# Patient Record
Sex: Female | Born: 1950 | Race: White | Hispanic: No | Marital: Single | Smoking: Never smoker
Health system: Southern US, Community
[De-identification: ages and names within clinical notes are randomized; demographics above are authoritative.]

## PROBLEM LIST (undated history)

## (undated) DIAGNOSIS — S83209A Unspecified tear of unspecified meniscus, current injury, unspecified knee, initial encounter: Secondary | ICD-10-CM

## (undated) DIAGNOSIS — G4730 Sleep apnea, unspecified: Secondary | ICD-10-CM

## (undated) DIAGNOSIS — C801 Malignant (primary) neoplasm, unspecified: Secondary | ICD-10-CM

## (undated) DIAGNOSIS — M8580 Other specified disorders of bone density and structure, unspecified site: Secondary | ICD-10-CM

## (undated) DIAGNOSIS — E039 Hypothyroidism, unspecified: Secondary | ICD-10-CM

## (undated) DIAGNOSIS — F419 Anxiety disorder, unspecified: Secondary | ICD-10-CM

## (undated) HISTORY — PX: MYOMECTOMY: SHX85

## (undated) HISTORY — DX: Hypothyroidism, unspecified: E03.9

## (undated) HISTORY — DX: Unspecified tear of unspecified meniscus, current injury, unspecified knee, initial encounter: S83.209A

## (undated) HISTORY — DX: Sleep apnea, unspecified: G47.30

## (undated) HISTORY — DX: Anxiety disorder, unspecified: F41.9

## (undated) HISTORY — PX: ABDOMINAL HYSTERECTOMY: SHX81

## (undated) HISTORY — DX: Malignant (primary) neoplasm, unspecified: C80.1

## (undated) HISTORY — PX: BREAST SURGERY: SHX581

## (undated) HISTORY — PX: ROTATOR CUFF REPAIR: SHX139

## (undated) HISTORY — PX: HYSTEROSCOPY: SHX211

## (undated) HISTORY — PX: OOPHORECTOMY: SHX86

## (undated) HISTORY — DX: Other specified disorders of bone density and structure, unspecified site: M85.80

---

## 1998-03-25 ENCOUNTER — Other Ambulatory Visit: Admission: RE | Admit: 1998-03-25 | Discharge: 1998-03-25 | Payer: Self-pay | Admitting: Gynecology

## 1998-12-30 ENCOUNTER — Encounter: Admission: RE | Admit: 1998-12-30 | Discharge: 1998-12-30 | Payer: Self-pay | Admitting: Cardiology

## 1998-12-30 ENCOUNTER — Encounter: Payer: Self-pay | Admitting: Cardiology

## 1999-04-14 ENCOUNTER — Other Ambulatory Visit: Admission: RE | Admit: 1999-04-14 | Discharge: 1999-04-14 | Payer: Self-pay | Admitting: Obstetrics and Gynecology

## 2000-06-14 ENCOUNTER — Other Ambulatory Visit: Admission: RE | Admit: 2000-06-14 | Discharge: 2000-06-14 | Payer: Self-pay | Admitting: Gynecology

## 2001-06-21 ENCOUNTER — Other Ambulatory Visit: Admission: RE | Admit: 2001-06-21 | Discharge: 2001-06-21 | Payer: Self-pay | Admitting: Gynecology

## 2001-07-17 ENCOUNTER — Encounter: Admission: RE | Admit: 2001-07-17 | Discharge: 2001-07-17 | Payer: Self-pay | Admitting: *Deleted

## 2001-07-17 ENCOUNTER — Encounter: Payer: Self-pay | Admitting: *Deleted

## 2002-06-26 ENCOUNTER — Other Ambulatory Visit: Admission: RE | Admit: 2002-06-26 | Discharge: 2002-06-26 | Payer: Self-pay | Admitting: Gynecology

## 2002-08-01 ENCOUNTER — Ambulatory Visit (HOSPITAL_COMMUNITY): Admission: RE | Admit: 2002-08-01 | Discharge: 2002-08-01 | Payer: Self-pay | Admitting: Gastroenterology

## 2003-07-29 ENCOUNTER — Other Ambulatory Visit: Admission: RE | Admit: 2003-07-29 | Discharge: 2003-07-29 | Payer: Self-pay | Admitting: Gynecology

## 2004-08-04 ENCOUNTER — Other Ambulatory Visit: Admission: RE | Admit: 2004-08-04 | Discharge: 2004-08-04 | Payer: Self-pay | Admitting: Gynecology

## 2004-12-22 ENCOUNTER — Emergency Department (HOSPITAL_COMMUNITY): Admission: EM | Admit: 2004-12-22 | Discharge: 2004-12-22 | Payer: Self-pay | Admitting: Emergency Medicine

## 2005-02-09 ENCOUNTER — Encounter: Admission: RE | Admit: 2005-02-09 | Discharge: 2005-02-09 | Payer: Self-pay | Admitting: Family Medicine

## 2005-02-22 DIAGNOSIS — C801 Malignant (primary) neoplasm, unspecified: Secondary | ICD-10-CM

## 2005-02-22 HISTORY — DX: Malignant (primary) neoplasm, unspecified: C80.1

## 2005-02-22 HISTORY — PX: BLADDER SURGERY: SHX569

## 2005-07-15 ENCOUNTER — Ambulatory Visit (HOSPITAL_BASED_OUTPATIENT_CLINIC_OR_DEPARTMENT_OTHER): Admission: RE | Admit: 2005-07-15 | Discharge: 2005-07-15 | Payer: Self-pay | Admitting: Urology

## 2005-07-15 ENCOUNTER — Encounter (INDEPENDENT_AMBULATORY_CARE_PROVIDER_SITE_OTHER): Payer: Self-pay | Admitting: Specialist

## 2005-08-06 ENCOUNTER — Other Ambulatory Visit: Admission: RE | Admit: 2005-08-06 | Discharge: 2005-08-06 | Payer: Self-pay | Admitting: Gynecology

## 2006-02-02 ENCOUNTER — Ambulatory Visit (HOSPITAL_COMMUNITY): Admission: RE | Admit: 2006-02-02 | Discharge: 2006-02-02 | Payer: Self-pay | Admitting: Urology

## 2006-04-22 ENCOUNTER — Encounter: Admission: RE | Admit: 2006-04-22 | Discharge: 2006-04-22 | Payer: Self-pay | Admitting: Family Medicine

## 2006-05-09 ENCOUNTER — Encounter: Admission: RE | Admit: 2006-05-09 | Discharge: 2006-05-09 | Payer: Self-pay | Admitting: Family Medicine

## 2006-08-16 ENCOUNTER — Other Ambulatory Visit: Admission: RE | Admit: 2006-08-16 | Discharge: 2006-08-16 | Payer: Self-pay | Admitting: Gynecology

## 2007-05-11 ENCOUNTER — Encounter: Admission: RE | Admit: 2007-05-11 | Discharge: 2007-05-11 | Payer: Self-pay | Admitting: Family Medicine

## 2007-06-01 ENCOUNTER — Encounter: Admission: RE | Admit: 2007-06-01 | Discharge: 2007-06-01 | Payer: Self-pay | Admitting: Family Medicine

## 2007-08-17 ENCOUNTER — Other Ambulatory Visit: Admission: RE | Admit: 2007-08-17 | Discharge: 2007-08-17 | Payer: Self-pay | Admitting: Gynecology

## 2008-01-08 ENCOUNTER — Ambulatory Visit: Payer: Self-pay | Admitting: Gynecology

## 2008-08-20 ENCOUNTER — Ambulatory Visit: Payer: Self-pay | Admitting: Gynecology

## 2008-08-20 ENCOUNTER — Encounter: Payer: Self-pay | Admitting: Gynecology

## 2008-08-20 ENCOUNTER — Other Ambulatory Visit: Admission: RE | Admit: 2008-08-20 | Discharge: 2008-08-20 | Payer: Self-pay | Admitting: Gynecology

## 2008-08-27 ENCOUNTER — Ambulatory Visit: Payer: Self-pay | Admitting: Gynecology

## 2009-02-07 ENCOUNTER — Encounter: Admission: RE | Admit: 2009-02-07 | Discharge: 2009-02-07 | Payer: Self-pay | Admitting: Family Medicine

## 2009-02-22 HISTORY — PX: EYE SURGERY: SHX253

## 2009-08-27 ENCOUNTER — Ambulatory Visit: Payer: Self-pay | Admitting: Gynecology

## 2009-08-27 ENCOUNTER — Other Ambulatory Visit: Admission: RE | Admit: 2009-08-27 | Discharge: 2009-08-27 | Payer: Self-pay | Admitting: Gynecology

## 2009-09-02 ENCOUNTER — Ambulatory Visit: Payer: Self-pay | Admitting: Gynecology

## 2009-10-07 ENCOUNTER — Ambulatory Visit: Payer: Self-pay | Admitting: Gynecology

## 2009-12-03 ENCOUNTER — Encounter: Admission: RE | Admit: 2009-12-03 | Discharge: 2009-12-03 | Payer: Self-pay | Admitting: Family Medicine

## 2010-04-29 ENCOUNTER — Ambulatory Visit (INDEPENDENT_AMBULATORY_CARE_PROVIDER_SITE_OTHER): Payer: BC Managed Care – PPO | Admitting: Gynecology

## 2010-04-29 DIAGNOSIS — B373 Candidiasis of vulva and vagina: Secondary | ICD-10-CM

## 2010-04-29 DIAGNOSIS — N898 Other specified noninflammatory disorders of vagina: Secondary | ICD-10-CM

## 2010-04-29 DIAGNOSIS — L293 Anogenital pruritus, unspecified: Secondary | ICD-10-CM

## 2010-04-29 DIAGNOSIS — B3731 Acute candidiasis of vulva and vagina: Secondary | ICD-10-CM

## 2010-07-10 NOTE — Op Note (Signed)
NAMEMONICIA, TSE                 ACCOUNT NO.:  [id number]   MEDICAL RECORD NO.:  [id number]          PATIENT TYPE:  AMB   LOCATION:  [facility]                         FACILITY:  [facility]   PHYSICIAN:  Bertram Millard. Dahlstedt, M.D.DATE OF BIRTH:  31-Jul-1950   DATE OF PROCEDURE:  07/15/2005  DATE OF DISCHARGE:                                 OPERATIVE REPORT   PREOPERATIVE DIAGNOSIS:  Bladder tumor.   POSTOPERATIVE DIAGNOSIS:  Bladder tumor.   PROCEDURE:  Cystoscopy, TURBT, 2 cm tumor.   SURGEON:  Bertram Millard. Dahlstedt, M.D.   ANESTHESIA:  General.   COMPLICATIONS:  None.   BRIEF HISTORY:  A 60 year old female with a history of hematuria.  She  initially presented about five months ago.  She had a CT urogram which she  was negative.  Follow-up urinalyses revealed clear urine.  She was recently  seen and she still had a little bit of discolored urine.  Cystoscopic  evaluation of the patient's bladder revealed a small tumor on the left  lateral wall.  She presents at this time for TURBT.  Risks and complications  have been discussed with the patient.   DESCRIPTION OF PROCEDURE:  The patient was administered preoperative IV  antibiotic and taken to the operating room where general anesthetic was  administered.  She was placed in dorsal lithotomy position.  Genitalia and  perineum were prepped and draped.  A 22-French panendoscope was advanced in  her bladder.  The entire bladder was inspected.  No trabeculations or  foreign bodies were noted.  Ureteral orifices were normal.  A 2 cm tumor was  found lateral and superior to the left ureteral orifice.  It was totally  removed with cold cup biopsy forceps, getting deep biopsies as well.  All of  these samples were sent as bladder tumor.  The biopsy site was then  cauterized well with the Bovie electrode.  No bleeding was seen.  At this  point, the bladder was drained.  It was not felt she needed a catheter.  B &  O suppository was placed.   The patient tolerated procedure well.  She is awakened, taken to PACU in  stable condition.  She will follow-up in one week.  I will call her with  pathology result.  She was discharged on urelle one p.o. q.6h. p.r.n.  bladder discomfort, Macrobid one p.o. b.i.d. x3 days and Darvocet one p.o.  q.4h. p.r.n. pain.      Bertram Millard. Dahlstedt, M.D.  Electronically Signed     SMD/MEDQ  D:  07/15/2005  T:  07/15/2005  Job:  [id number]   cc:   Talmadge Coventry, M.D.  Fax: [phone]   Allena Napoleon  Fax: [phone]

## 2010-07-10 NOTE — Op Note (Signed)
   NAME:  Mindy Burns, Mindy Burns                           ACCOUNT NO.:  [id number]   MEDICAL RECORD NO.:  [id number]                   PATIENT TYPE:  AMB   LOCATION:  ENDO                                 FACILITY:  [facility]   PHYSICIAN:  Anselmo Rod, M.D.               DATE OF BIRTH:  [birth year]   DATE OF PROCEDURE:  08/01/2002  DATE OF DISCHARGE:                                 OPERATIVE REPORT   PROCEDURE PERFORMED:  Screening colonoscopy.   ENDOSCOPIST:  Charna Elizabeth, M.D.   INSTRUMENT USED:  Olympus video colonoscope.   INDICATIONS FOR PROCEDURE:  The patient is a 60 year old white female with a  history of rectal bleeding.  Rule out colonic polyps, masses, etc.   PREPROCEDURE PREPARATION:  Informed consent was procured from the patient.  The patient was fasted for eight hours prior to the procedure and prepped  with a bottle of magnesium citrate and a gallon of GoLYTELY the night prior  to the procedure.   PREPROCEDURE PHYSICAL:  The patient had stable vital signs.  Neck supple.  Chest clear to auscultation.  S1 and S2 regular.  Abdomen soft with normal  bowel sounds.   DESCRIPTION OF PROCEDURE:  The patient was placed in left lateral decubitus  position and sedated with 60 mg of Demerol and 6 mg of Versed intravenously.  Once the patient was adequately sedated and maintained on low flow oxygen  and continuous cardiac monitoring, the Olympus video colonoscope was  advanced from the rectum to the cecum without difficulty.  The appendicular  orifice and ileocecal valve were clearly visualized and photographed.  No  masses, polyps, erosions, ulcerations or diverticula were seen.  Small  internal hemorrhoids were seen on retroflexion in the rectum.  A prominent  anal papilla was also noticed at this time.  The patient tolerated the  procedure well without complication.   IMPRESSION:  1. Normal colonoscopy up to the cecum except for nonbleeding internal     hemorrhoids.  2.  Prominent anal papilla seen on retroflexion.   RECOMMENDATIONS:  1. A high fiber diet with liberal fluid intake has been advocated.  2. Repeat colorectal cancer screening is recommended in the next five years     unless the patient develops any abnormal symptoms in the interim.  3. Outpatient follow-up as need arises in the future.                                                   Anselmo Rod, M.D.    JNM/MEDQ  D:  08/02/2002  T:  08/02/2002  Job:  [id number]   cc:   Christella Noa, M.D.  [address]  [place], [place] [address]  Fax: [phone]

## 2010-07-10 NOTE — Op Note (Signed)
   NAME:  Mindy Burns, Mindy Burns                           ACCOUNT NO.:  [id number]   MEDICAL RECORD NO.:  [id number]                   PATIENT TYPE:  AMB   LOCATION:  ENDO                                 FACILITY:  [facility]   PHYSICIAN:  Anselmo Rod, M.D.               DATE OF BIRTH:  09-10-1950   DATE OF PROCEDURE:  08/01/2002  DATE OF DISCHARGE:                                 OPERATIVE REPORT   PROCEDURE PERFORMED:  Screening colonoscopy.   ENDOSCOPIST:  Anselmo Rod, M.D.   INSTRUMENT USED:  Olympus video colonoscope.   INDICATIONS FOR PROCEDURE:  A 60 year old white female with a family history  of colon cancer   Dictation ended at this point.                                               Anselmo Rod, M.D.    JNM/MEDQ  D:  08/02/2002  T:  08/02/2002  Job:  [id number]   cc:   Christella Noa, M.D.  [address] [place]  [place], [place] [address]  Fax: [phone]

## 2010-10-13 ENCOUNTER — Encounter: Payer: BC Managed Care – PPO | Admitting: Gynecology

## 2010-10-14 ENCOUNTER — Encounter: Payer: BC Managed Care – PPO | Admitting: Gynecology

## 2010-10-15 ENCOUNTER — Ambulatory Visit (INDEPENDENT_AMBULATORY_CARE_PROVIDER_SITE_OTHER): Payer: BC Managed Care – PPO | Admitting: Gynecology

## 2010-10-15 ENCOUNTER — Encounter: Payer: Self-pay | Admitting: Gynecology

## 2010-10-15 ENCOUNTER — Encounter: Payer: BC Managed Care – PPO | Admitting: Gynecology

## 2010-10-15 ENCOUNTER — Other Ambulatory Visit (HOSPITAL_COMMUNITY)
Admission: RE | Admit: 2010-10-15 | Discharge: 2010-10-15 | Disposition: A | Discharge status: Home / Self Care | Payer: BC Managed Care – PPO | Source: Ambulatory Visit | Attending: Gynecology | Admitting: Gynecology

## 2010-10-15 VITALS — BP 120/70 | Ht 66.0 in | Wt 173.0 lb

## 2010-10-15 DIAGNOSIS — Z01419 Encounter for gynecological examination (general) (routine) without abnormal findings: Secondary | ICD-10-CM

## 2010-10-15 DIAGNOSIS — B373 Candidiasis of vulva and vagina: Secondary | ICD-10-CM

## 2010-10-15 DIAGNOSIS — C801 Malignant (primary) neoplasm, unspecified: Secondary | ICD-10-CM | POA: Insufficient documentation

## 2010-10-15 DIAGNOSIS — M949 Disorder of cartilage, unspecified: Secondary | ICD-10-CM

## 2010-10-15 DIAGNOSIS — M8580 Other specified disorders of bone density and structure, unspecified site: Secondary | ICD-10-CM

## 2010-10-15 DIAGNOSIS — F419 Anxiety disorder, unspecified: Secondary | ICD-10-CM | POA: Insufficient documentation

## 2010-10-15 DIAGNOSIS — E039 Hypothyroidism, unspecified: Secondary | ICD-10-CM | POA: Insufficient documentation

## 2010-10-15 DIAGNOSIS — N898 Other specified noninflammatory disorders of vagina: Secondary | ICD-10-CM

## 2010-10-15 MED ORDER — FLUCONAZOLE 150 MG PO TABS: 150.0000 mg | ORAL_TABLET | Freq: Once | ORAL | Status: AC

## 2010-10-15 NOTE — Progress Notes (Signed)
Mindy Burns 12/02/1950 [id number]        60 y.o.  for annual exam.  Doing well from a gynecologic standpoint. She does note feeling a little funny in the vaginal area not really irritated but different. 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 She's down to once a year followup with him.  She is being seen for anal fissures by Dr. Loreta Ave she is up-to-date with her colon screening.  Past medical history,surgical history, allergies, family history and social history were all reviewed and documented in the EPIC chart. ROS:  Was performed and pertinent positives and negatives are included in the history.  Exam: chaperone present Filed Vitals:   10/15/10 1418  BP: 120/70   General appearance  Normal Skin grossly normal Head/Neck normal with no cervical or supraclavicular adenopathy thyroid normal Lungs  clear Cardiac RR, without RMG Abdominal  soft, nontender, without masses, organomegaly or hernia Breasts  examined lying and sitting without masses, retractions, discharge or axillary adenopathy. Pelvic  Ext/BUS/vagina  normal white discharge noted KOH wet prep done Pap of cuff done  Adnexa  Without masses or tenderness    Anus and perineum  normal   Rectovaginal  normal sphincter tone without palpated masses or tenderness.  No overt fissures noted   Assessment/Plan:  60 y.o. female for annual exam. #1 Vaginal discharge. Wet prep positive for yeast we'll treat with Diflucan 150x1 dose follow up if symptoms persist or recur.  I suspect that this accounts for her feeling different vaginally if these symptoms persist despite being treated for usually present for evaluation. #2 History of osteopenia.  She has a history of osteopenia with a -1.7 on a bone density. Her study 2 years ago was normal at a -1. I've recommended she repeat bone density now if again normal then we'll go to less frequent screening  interval. Increase calcium vitamin D reviewed. #3 Health maintenance. Self breast exams on a month basis discussed encouraged. She is due for her mammogram now and knows to schedule this. She's up-to-date with colon screening at Dr. Loreta Ave office. No blood work was done today as was all done through her primary who actively follows her for her medical issues.    [name] MD, 4:28 PM 10/15/2010

## 2010-10-16 ENCOUNTER — Telehealth: Payer: Self-pay | Admitting: *Deleted

## 2010-10-16 DIAGNOSIS — K602 Anal fissure, unspecified: Secondary | ICD-10-CM

## 2010-10-16 NOTE — Telephone Encounter (Signed)
PT CALLED STATING THAT SHE AND DR.F SPOKE ABOUT A POSSIBLE RX FOR HER ANAL FISSURES ON HER OFFICE VISIT 10/15/10. NONE THING IN TF NOTES REGARDING THIS. TOLD PT THAT TF IS OUT AND WILL RETURN ON Monday. I WILL SENT MESSAGE TO HIM THEN. SHE WAS OKAY WITH THIS.

## 2010-10-19 MED ORDER — HYDROCORTISONE ACE-PRAMOXINE 2.5-1 % RE CREA: TOPICAL_CREAM | Freq: Three times a day (TID) | RECTAL | Status: AC

## 2010-10-19 NOTE — Telephone Encounter (Signed)
Pt called stating on office visit 10/15/10 you spoke about possible rx for her anal fissures? Nonething noted regarding this.  Please advise.

## 2010-10-19 NOTE — Telephone Encounter (Signed)
PT INFORMED WITH THE BELOW NOTE. 

## 2010-10-19 NOTE — Telephone Encounter (Signed)
I prescribed Analpram as directed.

## 2010-10-22 ENCOUNTER — Ambulatory Visit (INDEPENDENT_AMBULATORY_CARE_PROVIDER_SITE_OTHER): Payer: BC Managed Care – PPO | Admitting: Gynecology

## 2010-10-22 DIAGNOSIS — M949 Disorder of cartilage, unspecified: Secondary | ICD-10-CM

## 2010-10-22 DIAGNOSIS — M8580 Other specified disorders of bone density and structure, unspecified site: Secondary | ICD-10-CM

## 2010-10-23 ENCOUNTER — Telehealth: Payer: Self-pay | Admitting: Gynecology

## 2010-10-23 NOTE — Telephone Encounter (Signed)
Tell patient I reviewed her bone density report and compared them to her studies that were done at [facility].  Compared to our prior study 2 years ago performed here she has some bone loss and now is in the osteopenic range. When compared to her studies of Yolanda Bonine back in 07 there is no significant change. I think given this information I would do nothing different but repeat her bone density in 2 years.

## 2010-10-27 NOTE — Telephone Encounter (Signed)
Pt informed with the below message and that her bone density repeat should be in 2 years.

## 2011-02-01 ENCOUNTER — Ambulatory Visit (INDEPENDENT_AMBULATORY_CARE_PROVIDER_SITE_OTHER): Payer: BC Managed Care – PPO | Admitting: Gynecology

## 2011-02-01 ENCOUNTER — Encounter: Payer: Self-pay | Admitting: Gynecology

## 2011-02-01 ENCOUNTER — Ambulatory Visit: Payer: BC Managed Care – PPO | Admitting: Gynecology

## 2011-02-01 DIAGNOSIS — N644 Mastodynia: Secondary | ICD-10-CM

## 2011-02-01 NOTE — Progress Notes (Signed)
The patient presents complaining of one to 2 day history of left breast tenderness and questionable lump. She has history of very dense breasts and she thinks she feels a new lumbar is not quite sure but she still point tender in this one area which is new to her. No history of trauma nipple discharge or other symptoms. She is over a year from mammogram. She does get thermography and alternates [place] annually with mammograms.  Exam chaperone present Breasts examined lying and sitting without masses retractions discharge adenopathy bilaterally. The area she is pointing to his the left tail of Spence I do not feel any abnormalities underlying.  Assessment and plan: Left breast mastodynia. Will start with diagnostic mammography and ultrasound over this area and she knows importance of follow up. Patient will follow up with me after testing for triage based on results.

## 2011-02-01 NOTE — Patient Instructions (Signed)
Office will call to schedule mammogram and ultrasound

## 2011-02-05 ENCOUNTER — Telehealth: Payer: Self-pay | Admitting: *Deleted

## 2011-02-05 DIAGNOSIS — N644 Mastodynia: Secondary | ICD-10-CM

## 2011-02-05 NOTE — Telephone Encounter (Signed)
Patient informed appt set up with [facility] on 02/09/11 @ 10:30.  Order faxed.

## 2011-02-09 ENCOUNTER — Other Ambulatory Visit: Payer: Self-pay | Admitting: Gynecology

## 2011-02-09 DIAGNOSIS — N644 Mastodynia: Secondary | ICD-10-CM

## 2011-10-20 ENCOUNTER — Encounter: Payer: BC Managed Care – PPO | Admitting: Gynecology

## 2011-11-16 ENCOUNTER — Ambulatory Visit (INDEPENDENT_AMBULATORY_CARE_PROVIDER_SITE_OTHER): Payer: BC Managed Care – PPO | Admitting: Gynecology

## 2011-11-16 ENCOUNTER — Encounter: Payer: Self-pay | Admitting: Gynecology

## 2011-11-16 VITALS — BP 132/82 | Ht 68.0 in | Wt 168.0 lb

## 2011-11-16 DIAGNOSIS — M899 Disorder of bone, unspecified: Secondary | ICD-10-CM

## 2011-11-16 DIAGNOSIS — M8580 Other specified disorders of bone density and structure, unspecified site: Secondary | ICD-10-CM

## 2011-11-16 DIAGNOSIS — Z01419 Encounter for gynecological examination (general) (routine) without abnormal findings: Secondary | ICD-10-CM

## 2011-11-16 NOTE — Patient Instructions (Addendum)
Follow up for annual exam in one year 

## 2011-11-16 NOTE — Progress Notes (Signed)
Mindy Burns 1950/06/18 [id number]        61 y.o.  G0P0 for annual exam.  Doing well.  Past medical history,surgical history, medications, allergies, family history and social history were all reviewed and documented in the EPIC chart. ROS:  Was performed and pertinent positives and negatives are included in the history.  Exam: Fleet Contras assistant Filed Vitals:   11/16/11 0929  BP: 132/82  Height: 5\' 8"  (1.727 m)  Weight: 168 lb (76.204 kg)   General appearance  Normal Skin grossly normal Head/Neck normal with no cervical or supraclavicular adenopathy thyroid normal Lungs  clear Cardiac RR, without RMG Abdominal  soft, nontender, without masses, organomegaly or hernia Breasts  examined lying and sitting without masses, retractions, discharge or axillary adenopathy. Pelvic  Ext/BUS/vagina  normal with mild atrophic changes narrow consistent with virginal status.  Adnexa  Without masses or tenderness    Anus and perineum  normal   Rectovaginal  normal sphincter tone without palpated masses or tenderness.    Assessment/Plan:  61 y.o. G0P0 female for annual exam.   1. Status post TAH/BSO for leiomyomata. Doing well off HRT. We'll continue to monitor. 2. Mammography. Patient coming due in December and I reminded her to schedule this. SBE monthly reviewed. 3. Pap smear.  No Pap smear done today. Last Pap smear 2012. No history of abnormal Pap smears before with numerous normal reports in her chart. History of hysterectomy for benign indications. I reviewed current screening guidelines we'll plan stop screening at this point and she agrees with this. 4. Colonoscopy. Patient is current with her colonoscopy and knows to contact her gastroenterologist to schedule when it is due.. 5. Osteopenia. DEXA 09/2010 T score -1.6 FRAX 8.0/0.7.  Increased calcium vitamin D reviewed. We'll plan repeat in another several years. 6. Transitional cell carcinoma of the bladder. Patient is actively followed by  Dr. Retta Diones and will continue to see him. Check urinalysis today. 7. Health maintenance. Number done today as it is all done through her primary physician's office who she actively sees. Follow up one year, sooner as needed.   Mindy Lords MD, 12:24 PM 11/16/2011

## 2011-11-17 LAB — URINALYSIS W MICROSCOPIC + REFLEX CULTURE
Bilirubin Urine: NEGATIVE
Glucose, UA: NEGATIVE mg/dL
Hgb urine dipstick: NEGATIVE
Leukocytes, UA: NEGATIVE
Urobilinogen, UA: 0.2 mg/dL (ref 0.0–1.0)
pH: 7 (ref 5.0–8.0)

## 2012-10-25 ENCOUNTER — Other Ambulatory Visit: Payer: Self-pay | Admitting: Gynecology

## 2012-10-25 DIAGNOSIS — M8580 Other specified disorders of bone density and structure, unspecified site: Secondary | ICD-10-CM

## 2012-11-16 ENCOUNTER — Encounter: Payer: BC Managed Care – PPO | Admitting: Gynecology

## 2012-12-19 ENCOUNTER — Ambulatory Visit (INDEPENDENT_AMBULATORY_CARE_PROVIDER_SITE_OTHER): Payer: BC Managed Care – PPO

## 2012-12-19 ENCOUNTER — Encounter: Payer: Self-pay | Admitting: Gynecology

## 2012-12-19 DIAGNOSIS — M899 Disorder of bone, unspecified: Secondary | ICD-10-CM

## 2012-12-19 DIAGNOSIS — M8580 Other specified disorders of bone density and structure, unspecified site: Secondary | ICD-10-CM

## 2012-12-20 ENCOUNTER — Encounter: Payer: Self-pay | Admitting: Gynecology

## 2012-12-20 ENCOUNTER — Ambulatory Visit (INDEPENDENT_AMBULATORY_CARE_PROVIDER_SITE_OTHER): Payer: BC Managed Care – PPO | Admitting: Gynecology

## 2012-12-20 ENCOUNTER — Other Ambulatory Visit (HOSPITAL_COMMUNITY)
Admission: RE | Admit: 2012-12-20 | Discharge: 2012-12-20 | Disposition: A | Discharge status: Home / Self Care | Payer: BC Managed Care – PPO | Source: Ambulatory Visit | Attending: Gynecology | Admitting: Gynecology

## 2012-12-20 VITALS — BP 120/76 | Ht 66.0 in | Wt 158.0 lb

## 2012-12-20 DIAGNOSIS — M8580 Other specified disorders of bone density and structure, unspecified site: Secondary | ICD-10-CM

## 2012-12-20 DIAGNOSIS — M899 Disorder of bone, unspecified: Secondary | ICD-10-CM

## 2012-12-20 DIAGNOSIS — Z01419 Encounter for gynecological examination (general) (routine) without abnormal findings: Secondary | ICD-10-CM

## 2012-12-20 NOTE — Progress Notes (Signed)
Mindy Burns Dec 29, 1950 [id number]        62 y.o.  G0P0 for annual exam.  Doing well without complaints.  Past medical history,surgical history, medications, allergies, family history and social history were all reviewed and documented in the EPIC chart.  ROS:  Performed and pertinent positives and negatives are included in the history, assessment and plan .  Exam: Kim assistant Filed Vitals:   12/20/12 1028  BP: 120/76  Height: 5\' 6"  (1.676 m)  Weight: 158 lb (71.668 kg)   General appearance  Normal Skin grossly normal Head/Neck normal with no cervical or supraclavicular adenopathy thyroid normal Lungs  clear Cardiac RR, without RMG Abdominal  soft, nontender, without masses, organomegaly or hernia Breasts  examined lying and sitting without masses, retractions, discharge or axillary adenopathy. Pelvic  Ext/BUS/vagina  normal with mild atrophic changes. Pap of cuff done   Adnexa  Without masses or tenderness    Anus and perineum  normal   Rectovaginal  normal sphincter tone without palpated masses or tenderness.    Assessment/Plan:  62 y.o. G0P0 female for annual exam.   1. Postmenopausal status post TAH BSO for leiomyoma. Doing well without significant hot flashes, night sweats, vaginal dryness. Will continue to monitor. 2. Osteopenia. DEXA 11/2012 with T score -1.7. FRAX 9%/0.9%. Statistically significant loss at the spine but still within the normal range. Increase calcium vitamin D and exercise reviewed. Plan repeat in 2 years. 3. Mammography 2012. Does do thermography at times. Is reluctant to do mammograms due to fear of radiation risk. Maternal history of breast cancer postmenopausal. Reviewed my strong recommendation that she should continue with mammography. Benefits of early detection and treatment reviewed. Patient agrees to schedule this year and will arrange for this. SBE monthly reviewed. 4. Pap smear 2012. Pap of cuff done today. She is status post hysterectomy for  benign indications also has a history of transitional cell carcinoma of the bladder. We'll plan continued surveillance at a less frequent interval. 5. Colonoscopy 2014. Repeat at their recommended interval. 6. Health maintenance. Patient has all of her routine blood work done through her primary physician's office. Followup one year, sooner as needed.  Note: This document was prepared with digital dictation and possible smart phrase technology. Any transcriptional errors that result from this process are unintentional.   [name] MD, 10:51 AM 12/20/2012

## 2012-12-20 NOTE — Patient Instructions (Signed)
Call to Schedule your mammogram  Facilities in [place]: 1)  [facility], [address] Phone: [phone] 2)  [facility], [address] Phone: [phone] 3)  Dr. Bertrand at [facility]  [address] Phone: [phone]     Mammogram A mammogram is an X-ray test to find changes in a woman's breast. You should get a mammogram if:  You are 61 years of age or older  You have risk factors.   Your doctor recommends that you have one.  BEFORE THE TEST  Do not schedule the test the week before your period, especially if your breasts are sore during this time.  On the day of your mammogram:  Wash your breasts and armpits well. After washing, do not put on any deodorant or talcum powder on until after your test.   Eat and drink as you usually do.   Take your medicines as usual.   If you are diabetic and take insulin, make sure you:   Eat before coming for your test.   Take your insulin as usual.   If you cannot keep your appointment, call before the appointment to cancel. Schedule another appointment.  TEST  You will need to undress from the waist up. You will put on a hospital gown.   Your breast will be put on the mammogram machine, and it will press firmly on your breast with a piece of plastic called a compression paddle. This will make your breast flatter so that the machine can X-ray all parts of your breast.   Both breasts will be X-rayed. Each breast will be X-rayed from above and from the side. An X-ray might need to be taken again if the picture is not good enough.   The mammogram will last about 15 to 30 minutes.  AFTER THE TEST Finding out the results of your test Ask when your test results will be ready. Make sure you get your test results.  Document Released: 05/07/2008 Document Revised: 01/28/2011 Document Reviewed: 05/07/2008 [employer] Patient  Information 2012 [employer]   

## 2012-12-20 NOTE — Addendum Note (Signed)
Addended by: Dayna Barker on: 12/20/2012 11:32 AM   Modules accepted: Orders

## 2012-12-21 LAB — URINALYSIS W MICROSCOPIC + REFLEX CULTURE
Bacteria, UA: NONE SEEN
Casts: NONE SEEN
Crystals: NONE SEEN
Glucose, UA: NEGATIVE mg/dL
Ketones, ur: NEGATIVE mg/dL
Nitrite: NEGATIVE
Protein, ur: NEGATIVE mg/dL
Squamous Epithelial / LPF: NONE SEEN
Urobilinogen, UA: 0.2 mg/dL (ref 0.0–1.0)
pH: 6.5 (ref 5.0–8.0)

## 2012-12-29 ENCOUNTER — Encounter: Payer: Self-pay | Admitting: Gynecology

## 2013-12-21 ENCOUNTER — Encounter: Payer: BC Managed Care – PPO | Admitting: Gynecology

## 2014-01-08 ENCOUNTER — Encounter: Payer: Self-pay | Admitting: Gynecology

## 2014-01-08 ENCOUNTER — Ambulatory Visit (INDEPENDENT_AMBULATORY_CARE_PROVIDER_SITE_OTHER): Payer: BC Managed Care – PPO | Admitting: Gynecology

## 2014-01-08 VITALS — BP 114/62 | Ht 66.0 in | Wt 164.0 lb

## 2014-01-08 DIAGNOSIS — Z01419 Encounter for gynecological examination (general) (routine) without abnormal findings: Secondary | ICD-10-CM

## 2014-01-08 NOTE — Patient Instructions (Signed)
You may obtain a copy of any labs that were done today by logging onto MyChart as outlined in the instructions provided with your AVS (after visit summary). The office will not call with normal lab results but certainly if there are any significant abnormalities then we will contact you.   Health Maintenance, Female A healthy lifestyle and preventative care can promote health and wellness.  Maintain regular health, dental, and eye exams.  Eat a healthy diet. Foods like vegetables, fruits, whole grains, low-fat dairy products, and lean protein foods contain the nutrients you need without too many calories. Decrease your intake of foods high in solid fats, added sugars, and salt. Get information about a proper diet from your caregiver, if necessary.  Regular physical exercise is one of the most important things you can do for your health. Most adults should get at least 150 minutes of moderate-intensity exercise (any activity that increases your heart rate and causes you to sweat) each week. In addition, most adults need muscle-strengthening exercises on 2 or more days a week.   Maintain a healthy weight. The body mass index (BMI) is a screening tool to identify possible weight problems. It provides an estimate of body fat based on height and weight. Your caregiver can help determine your BMI, and can help you achieve or maintain a healthy weight. For adults 20 years and older:  A BMI below 18.5 is considered underweight.  A BMI of 18.5 to 24.9 is normal.  A BMI of 25 to 29.9 is considered overweight.  A BMI of 30 and above is considered obese.  Maintain normal blood lipids and cholesterol by exercising and minimizing your intake of saturated fat. Eat a balanced diet with plenty of fruits and vegetables. Blood tests for lipids and cholesterol should begin at age 61 and be repeated every 5 years. If your lipid or cholesterol levels are high, you are over 50, or you are a high risk for heart  disease, you may need your cholesterol levels checked more frequently.Ongoing high lipid and cholesterol levels should be treated with medicines if diet and exercise are not effective.  If you smoke, find out from your caregiver how to quit. If you do not use tobacco, do not start.  Lung cancer screening is recommended for adults aged 33 80 years who are at high risk for developing lung cancer because of a history of smoking. Yearly low-dose computed tomography (CT) is recommended for people who have at least a 30-pack-year history of smoking and are a current smoker or have quit within the past 15 years. A pack year of smoking is smoking an average of 1 pack of cigarettes a day for 1 year (for example: 1 pack a day for 30 years or 2 packs a day for 15 years). Yearly screening should continue until the smoker has stopped smoking for at least 15 years. Yearly screening should also be stopped for people who develop a health problem that would prevent them from having lung cancer treatment.  If you are pregnant, do not drink alcohol. If you are breastfeeding, be very cautious about drinking alcohol. If you are not pregnant and choose to drink alcohol, do not exceed 1 drink per day. One drink is considered to be 12 ounces (355 mL) of beer, 5 ounces (148 mL) of wine, or 1.5 ounces (44 mL) of liquor.  Avoid use of street drugs. Do not share needles with anyone. Ask for help if you need support or instructions about stopping  the use of drugs.  High blood pressure causes heart disease and increases the risk of stroke. Blood pressure should be checked at least every 1 to 2 years. Ongoing high blood pressure should be treated with medicines, if weight loss and exercise are not effective.  If you are 59 to 62 years old, ask your caregiver if you should take aspirin to prevent strokes.  Diabetes screening involves taking a blood sample to check your fasting blood sugar level. This should be done once every 3  years, after age 91, if you are within normal weight and without risk factors for diabetes. Testing should be considered at a younger age or be carried out more frequently if you are overweight and have at least 1 risk factor for diabetes.  Breast cancer screening is essential preventative care for women. You should practice "breast self-awareness." This means understanding the normal appearance and feel of your breasts and may include breast self-examination. Any changes detected, no matter how small, should be reported to a caregiver. Women in their 66s and 30s should have a clinical breast exam (CBE) by a caregiver as part of a regular health exam every 1 to 3 years. After age 101, women should have a CBE every year. Starting at age 100, women should consider having a mammogram (breast X-ray) every year. Women who have a family history of breast cancer should talk to their caregiver about genetic screening. Women at a high risk of breast cancer should talk to their caregiver about having an MRI and a mammogram every year.  Breast cancer gene (BRCA)-related cancer risk assessment is recommended for women who have family members with BRCA-related cancers. BRCA-related cancers include breast, ovarian, tubal, and peritoneal cancers. Having family members with these cancers may be associated with an increased risk for harmful changes (mutations) in the breast cancer genes BRCA1 and BRCA2. Results of the assessment will determine the need for genetic counseling and BRCA1 and BRCA2 testing.  The Pap test is a screening test for cervical cancer. Women should have a Pap test starting at age 57. Between ages 25 and 35, Pap tests should be repeated every 2 years. Beginning at age 37, you should have a Pap test every 3 years as long as the past 3 Pap tests have been normal. If you had a hysterectomy for a problem that was not cancer or a condition that could lead to cancer, then you no longer need Pap tests. If you are  between ages 50 and 76, and you have had normal Pap tests going back 10 years, you no longer need Pap tests. If you have had past treatment for cervical cancer or a condition that could lead to cancer, you need Pap tests and screening for cancer for at least 20 years after your treatment. If Pap tests have been discontinued, risk factors (such as a new sexual partner) need to be reassessed to determine if screening should be resumed. Some women have medical problems that increase the chance of getting cervical cancer. In these cases, your caregiver may recommend more frequent screening and Pap tests.  The human papillomavirus (HPV) test is an additional test that may be used for cervical cancer screening. The HPV test looks for the virus that can cause the cell changes on the cervix. The cells collected during the Pap test can be tested for HPV. The HPV test could be used to screen women aged 44 years and older, and should be used in women of any age  who have unclear Pap test results. After the age of 55, women should have HPV testing at the same frequency as a Pap test.  Colorectal cancer can be detected and often prevented. Most routine colorectal cancer screening begins at the age of 44 and continues through age 20. However, your caregiver may recommend screening at an earlier age if you have risk factors for colon cancer. On a yearly basis, your caregiver may provide home test kits to check for hidden blood in the stool. Use of a small camera at the end of a tube, to directly examine the colon (sigmoidoscopy or colonoscopy), can detect the earliest forms of colorectal cancer. Talk to your caregiver about this at age 86, when routine screening begins. Direct examination of the colon should be repeated every 5 to 10 years through age 13, unless early forms of pre-cancerous polyps or small growths are found.  Hepatitis C blood testing is recommended for all people born from [birth year] through 1964 and any  individual with known risks for hepatitis C.  Practice safe sex. Use condoms and avoid high-risk sexual practices to reduce the spread of sexually transmitted infections (STIs). Sexually active women aged 36 and younger should be checked for Chlamydia, which is a common sexually transmitted infection. Older women with new or multiple partners should also be tested for Chlamydia. Testing for other STIs is recommended if you are sexually active and at increased risk.  Osteoporosis is a disease in which the bones lose minerals and strength with aging. This can result in serious bone fractures. The risk of osteoporosis can be identified using a bone density scan. Women ages 20 and over and women at risk for fractures or osteoporosis should discuss screening with their caregivers. Ask your caregiver whether you should be taking a calcium supplement or vitamin D to reduce the rate of osteoporosis.  Menopause can be associated with physical symptoms and risks. Hormone replacement therapy is available to decrease symptoms and risks. You should talk to your caregiver about whether hormone replacement therapy is right for you.  Use sunscreen. Apply sunscreen liberally and repeatedly throughout the day. You should seek shade when your shadow is shorter than you. Protect yourself by wearing long sleeves, pants, a wide-brimmed hat, and sunglasses year round, whenever you are outdoors.  Notify your caregiver of new moles or changes in moles, especially if there is a change in shape or color. Also notify your caregiver if a mole is larger than the size of a pencil eraser.  Stay current with your immunizations. Document Released: 08/24/2010 Document Revised: 06/05/2012 Document Reviewed: 08/24/2010 [facility] Patient Information 2014 [employer].

## 2014-01-08 NOTE — Progress Notes (Signed)
Mindy Burns Jul 03, 1950 [id number]        63 y.o.  G0P0 for annual exam.  Several issues noted below.  Past medical history,surgical history, problem list, medications, allergies, family history and social history were all reviewed and documented as reviewed in the EPIC chart.  ROS:  12 system ROS performed with pertinent positives and negatives included in the history, assessment and plan.   Additional significant findings :  none   Exam: Kim Counsellor Vitals:   01/08/14 0809  BP: 114/62  Height: 5\' 6"  (1.676 m)  Weight: 164 lb (74.39 kg)   General appearance:  Normal affect, orientation and appearance. Skin: Grossly normal HEENT: Without gross lesions.  No cervical or supraclavicular adenopathy. Thyroid normal.  Lungs:  Clear without wheezing, rales or rhonchi Cardiac: RR, without RMG Abdominal:  Soft, nontender, without masses, guarding, rebound, organomegaly or hernia Breasts:  Examined lying and sitting without masses, retractions, discharge or axillary adenopathy. Pelvic:  Ext/BUS/vagina with atrophic changes  Adnexa  Without masses or tenderness    Anus and perineum  Normal   Rectovaginal  Normal sphincter tone without palpated masses or tenderness.    Assessment/Plan:  63 y.o. G0P0 female for annual exam.   1. Postmenopausal/atrophic genital changes. Status post TAH/BSO for leiomyoma.  Patient without significant symptoms of hot flashes, night sweats, vaginal dryness. Continue to monitor. 2. History of bladder cancer. Actively being followed by urology. 3. Osteopenia. DEXA 11/2012 T score -1.7 FRAX 9%/0.9%. Repeat DEXA next year to year interval. Increase calcium and vitamin D recommendations. 4. Mammography 12/2012. Had thermogram done this year. We again discussed the issues that thermogram does not replace mammography for screening. I recommended she repeat her mammography now on an annual basis and she understands my recommendation. SBE monthly reviewed. 5. Pap  smear 2014 normal. No history of significant abnormal Pap smears. Options to stop together versus less frequent screening intervals and she is status post hysterectomy for benign indications reviewed. Will readdress on an annual basis. 6. Colonoscopy 2014. Repeat at their recommended interval. 7. Health maintenance. No routine lab work done as she has this done at her primary physician's and her urologist's office. Follow up in one year, sooner as needed.     [name] MD, 8:26 AM 01/08/2014

## 2014-03-20 ENCOUNTER — Encounter: Payer: Self-pay | Admitting: Gynecology

## 2014-10-25 ENCOUNTER — Encounter: Payer: Self-pay | Admitting: Gynecology

## 2014-10-25 ENCOUNTER — Ambulatory Visit (INDEPENDENT_AMBULATORY_CARE_PROVIDER_SITE_OTHER): Payer: BC Managed Care – PPO | Admitting: Gynecology

## 2014-10-25 VITALS — BP 122/76

## 2014-10-25 DIAGNOSIS — N63 Unspecified lump in unspecified breast: Secondary | ICD-10-CM

## 2014-10-25 NOTE — Patient Instructions (Addendum)
Office will call you to arrange the mammogram and ultrasound of the breast.

## 2014-10-25 NOTE — Progress Notes (Signed)
Mindy Burns [birth year] [id number]        64 y.o.  G0P0 Presents complaining of some nodularity in the left breast 12 to 1:00 position several finger breaths of the areola. Noticed over the last month or 2. Does remember being looked at several years ago and on review of her reports in 2012 she had an ultrasound of the left breast in this area which was negative. Most recent mammogram January 2016 which was normal. No other masses or nipple discharge.  Past medical history,surgical history, problem list, medications, allergies, family history and social history were all reviewed and documented in the EPIC chart.  Directed ROS with pertinent positives and negatives documented in the history of present illness/assessment and plan.  Exam: Kim assistant Filed Vitals:   10/25/14 1352  BP: 122/76   General appearance:  Normal Both breasts lying and sitting. Right without masses retractions discharge adenopathy. Left with some mild nodularity 12 to 1:00 position several finger breaths off the areola which feels like normal breast tissue. No overlying skin changes nipple discharge or axillary adenopathy.  Assessment/Plan:  64 y.o. G0P0 with history and exam as above. We'll plan diagnostic mammography and ultrasound of the left breast. Patient knows importance of follow up and will help make this arrangements for her over the next several weeks.    [name] MD, 2:24 PM 10/25/2014

## 2014-10-29 ENCOUNTER — Telehealth: Payer: Self-pay | Admitting: *Deleted

## 2014-10-29 NOTE — Telephone Encounter (Signed)
Patient  mammogram and ultrasound that she will be out of town next week so scheduling it 2 weeks to 3 weeks from now okay.

## 2014-10-29 NOTE — Telephone Encounter (Signed)
Appointment on 11/18/14 @ 9:15am left this on pt voicemail per her request.

## 2014-10-29 NOTE — Telephone Encounter (Signed)
Order signed and faxed to [facility]

## 2014-10-29 NOTE — Telephone Encounter (Signed)
-----   Message from [name], MD sent at 10/25/2014  2:23 PM EDT ----- Schedule diagnostic mammography and ultrasound of left breast reference nodularity in the 12 to 1:00 position several finger breaths off the areola

## 2014-11-21 ENCOUNTER — Encounter: Payer: Self-pay | Admitting: Gynecology

## 2015-01-28 ENCOUNTER — Ambulatory Visit (INDEPENDENT_AMBULATORY_CARE_PROVIDER_SITE_OTHER): Payer: BC Managed Care – PPO | Admitting: Gynecology

## 2015-01-28 ENCOUNTER — Encounter: Payer: Self-pay | Admitting: Gynecology

## 2015-01-28 VITALS — BP 124/80 | Ht 66.0 in | Wt 171.0 lb

## 2015-01-28 DIAGNOSIS — N952 Postmenopausal atrophic vaginitis: Secondary | ICD-10-CM | POA: Diagnosis not present

## 2015-01-28 DIAGNOSIS — Z01419 Encounter for gynecological examination (general) (routine) without abnormal findings: Secondary | ICD-10-CM

## 2015-01-28 DIAGNOSIS — M8580 Other specified disorders of bone density and structure, unspecified site: Secondary | ICD-10-CM | POA: Diagnosis not present

## 2015-01-28 NOTE — Progress Notes (Signed)
Mindy Burns 1951-01-03 [id number]        64 y.o.  G0P0  No LMP recorded. Patient has had a hysterectomy. for annual exam.  Doing well without complaints  Past medical history,surgical history, problem list, medications, allergies, family history and social history were all reviewed and documented as reviewed in the EPIC chart.  ROS:  Performed with pertinent positives and negatives included in the history, assessment and plan.   Additional significant findings :  none   Exam: Leanne Lovely Vitals:   01/28/15 1515  BP: 124/80  Height: 5\' 6"  (1.676 m)  Weight: 171 lb (77.565 kg)   General appearance:  Normal affect, orientation and appearance. Skin: Grossly normal HEENT: Without gross lesions.  No cervical or supraclavicular adenopathy. Thyroid normal.  Lungs:  Clear without wheezing, rales or rhonchi Cardiac: RR, without RMG Abdominal:  Soft, nontender, without masses, guarding, rebound, organomegaly or hernia Breasts:  Examined lying and sitting without masses, retractions, discharge or axillary adenopathy. Pelvic:  Ext/BUS/vagina with atrophic changes  Adnexa  Without masses or tenderness    Anus and perineum  Normal   Rectovaginal  Normal sphincter tone without palpated masses or tenderness.    Assessment/Plan:  64 y.o. G0P0 female for annual exam.   1. Postmenopausal status post TAH/BSO in the past. Doing well without significant hot flushes, night sweats, vaginal dryness. Continue to monitor and report any issues 2. Recent diagnostic mammography and ultrasound due to some mild nodularity 12 to 1:00 position left breast. Both studies were negative consistent with normal tissue. Area has not changed to patients self exam. Exam today shows no evidence of significant nodularity. Continue with self breast exams monthly as long as no changes will follow expectantly. Follow up for routine ultrasound this coming year when due.  3. Osteopenia. DEXA 11/2012 T score -1.7  FRAX 9%/0.9%. Repeat this coming year and she is going to schedule it and follow up for this. SBE monthly reviewed.  4. Colonoscopy 2014. Repeat at their recommended interval. 5. Pap smear 2014. No Pap smear done today. No history of significant abnormal Pap smears previously. Options to stop screening altogether or less frequent screening intervals reviewed. Will readdress on an annual basis. 6. Health maintenance. Recently had routine blood work done at her primary physician's office. No lab work done today. Follow up for bone density is scheduled otherwise in one year, sooner as needed   [name] MD, 3:42 PM 01/28/2015

## 2015-01-28 NOTE — Patient Instructions (Signed)

## 2015-03-26 DIAGNOSIS — M8580 Other specified disorders of bone density and structure, unspecified site: Secondary | ICD-10-CM

## 2015-03-26 HISTORY — DX: Other specified disorders of bone density and structure, unspecified site: M85.80

## 2015-04-22 ENCOUNTER — Encounter: Payer: Self-pay | Admitting: Gynecology

## 2015-04-22 ENCOUNTER — Ambulatory Visit (INDEPENDENT_AMBULATORY_CARE_PROVIDER_SITE_OTHER): Payer: BC Managed Care – PPO

## 2015-04-22 ENCOUNTER — Other Ambulatory Visit: Payer: Self-pay | Admitting: Gynecology

## 2015-04-22 DIAGNOSIS — Z13820 Encounter for screening for osteoporosis: Secondary | ICD-10-CM

## 2015-04-22 DIAGNOSIS — M899 Disorder of bone, unspecified: Secondary | ICD-10-CM

## 2015-04-22 DIAGNOSIS — M8580 Other specified disorders of bone density and structure, unspecified site: Secondary | ICD-10-CM

## 2015-12-02 ENCOUNTER — Encounter: Payer: Self-pay | Admitting: Gynecology

## 2016-01-29 ENCOUNTER — Encounter: Payer: BC Managed Care – PPO | Admitting: Gynecology

## 2016-02-27 ENCOUNTER — Encounter: Payer: BC Managed Care – PPO | Admitting: Gynecology

## 2016-04-08 ENCOUNTER — Encounter: Payer: Self-pay | Admitting: Gynecology

## 2016-04-08 ENCOUNTER — Ambulatory Visit (INDEPENDENT_AMBULATORY_CARE_PROVIDER_SITE_OTHER): Payer: BC Managed Care – PPO | Admitting: Gynecology

## 2016-04-08 VITALS — BP 120/74 | Ht 66.0 in | Wt 174.0 lb

## 2016-04-08 DIAGNOSIS — Z01411 Encounter for gynecological examination (general) (routine) with abnormal findings: Secondary | ICD-10-CM | POA: Diagnosis not present

## 2016-04-08 DIAGNOSIS — N952 Postmenopausal atrophic vaginitis: Secondary | ICD-10-CM | POA: Diagnosis not present

## 2016-04-08 DIAGNOSIS — M8580 Other specified disorders of bone density and structure, unspecified site: Secondary | ICD-10-CM

## 2016-04-08 NOTE — Patient Instructions (Signed)

## 2016-04-08 NOTE — Progress Notes (Signed)
    Mindy Burns Feb 06, 1951 [id number]        66 y.o.  G0P0 for annual exam.    Past medical history,surgical history, problem list, medications, allergies, family history and social history were all reviewed and documented as reviewed in the EPIC chart.  ROS:  Performed with pertinent positives and negatives included in the history, assessment and plan.   Additional significant findings :  None   Exam: Mindy Burns assistant Vitals:   04/08/16 1429  BP: 120/74  Weight: 174 lb (78.9 kg)  Height: 5\' 6"  (1.676 m)   Body mass index is 28.08 kg/m.  General appearance:  Normal affect, orientation and appearance. Skin: Grossly normal HEENT: Without gross lesions.  No cervical or supraclavicular adenopathy. Thyroid normal.  Lungs:  Clear without wheezing, rales or rhonchi Cardiac: RR, without RMG Abdominal:  Soft, nontender, without masses, guarding, rebound, organomegaly or hernia Breasts:  Examined lying and sitting without masses, retractions, discharge or axillary adenopathy. Pelvic:  Ext, BUS, Vagina with atrophic changes  Adnexa without masses or tenderness    Anus and perineum normal   Rectovaginal normal sphincter tone without palpated masses or tenderness.    Assessment/Plan:  66 y.o. G0P0 female for annual exam.   1. Postmenopausal/atrophic genital changes. Status post TVH/BSO in the past. Doing well without significant hot flushes, night sweats, vaginal dryness. 2. Mammography 11/2015. Continue with annual mammography when due. SBE monthly reviewed. 3. Osteopenia. DEXA 03/2015 T score -1.7 FRAX 9%/1%. Plan repeat DEXA in another year or 2.   4. Colonoscopy 2014. Repeat at their recommended interval. 5. Pap smear 2014. No Pap smear done today. No history of abnormal Pap smears. Status post hysterectomy for benign indications. Reviewed current screening guidelines and patient is comfortable with stop screening. 6. [place] maintenance. No routine lab work done as patient  reports this done elsewhere. Follow up in one year, sooner as needed.   Mindy Auerbach MD, 3:05 PM 04/08/2016

## 2017-01-22 ENCOUNTER — Encounter: Payer: Self-pay | Admitting: Gynecology

## 2017-09-06 ENCOUNTER — Encounter: Payer: Self-pay | Admitting: Gynecology

## 2017-09-06 ENCOUNTER — Ambulatory Visit: Payer: Medicare Other | Admitting: Gynecology

## 2017-09-06 VITALS — BP 118/78 | Ht 66.5 in | Wt 169.0 lb

## 2017-09-06 DIAGNOSIS — M8580 Other specified disorders of bone density and structure, unspecified site: Secondary | ICD-10-CM | POA: Diagnosis not present

## 2017-09-06 DIAGNOSIS — Z01419 Encounter for gynecological examination (general) (routine) without abnormal findings: Secondary | ICD-10-CM

## 2017-09-06 DIAGNOSIS — N952 Postmenopausal atrophic vaginitis: Secondary | ICD-10-CM

## 2017-09-06 NOTE — Progress Notes (Signed)
    Mindy Burns 29-Jan-1951 [id number]        67 y.o.  G0P0 for annual gynecologic exam.  Without gynecologic complaints  Past medical history,surgical history, problem list, medications, allergies, family history and social history were all reviewed and documented as reviewed in the EPIC chart.  ROS:  Performed with pertinent positives and negatives included in the history, assessment and plan.   Additional significant findings : None   Exam: [name] assistant Vitals:   09/06/17 0836  BP: 118/78  Weight: 169 lb (76.7 kg)  Height: 5' 6.5" (1.689 m)   Body mass index is 26.87 kg/m.  General appearance:  Normal affect, orientation and appearance. Skin: Grossly normal HEENT: Without gross lesions.  No cervical or supraclavicular adenopathy. Thyroid normal.  Lungs:  Clear without wheezing, rales or rhonchi Cardiac: RR, without RMG Abdominal:  Soft, nontender, without masses, guarding, rebound, organomegaly or hernia Breasts:  Examined lying and sitting without masses, retractions, discharge or axillary adenopathy. Pelvic:  Ext, BUS, Vagina: Normal with atrophic changes  Adnexa: Without masses or tenderness    Anus and perineum: Normal   Rectovaginal: Normal sphincter tone without palpated masses or tenderness.    Assessment/Plan:  67 y.o. G0P0 female for annual gynecologic exam.  1. Postmenopausal/atrophic genital changes.  Status post TAH/BSO in the past.  No significant menopausal symptoms. 2. Mammography coming due in December and I reminded her to schedule this.  Breast exam normal today. 3. Pap smear 2014.  No Pap smear done today.  No history of abnormal Pap smears.  We both agree per current screening guidelines to stop screening based on hysterectomy history. 4. Colonoscopy 2014.  Repeat at their recommended interval. 5. Osteopenia.  DEXA 2017 T score -1.7 FRAX 9% / 1%.  Repeat DEXA now on patient will schedule in follow-up for this. 6. Health maintenance.  No  routine lab work done as patient does this elsewhere.  Follow-up 1 year, sooner as needed.   [name] MD, 9:03 AM 09/06/2017

## 2017-09-06 NOTE — Patient Instructions (Signed)
Follow-up for the bone density as scheduled.  Follow-up in 1 year for annual exam 

## 2017-09-27 ENCOUNTER — Encounter: Payer: Self-pay | Admitting: Gynecology

## 2017-09-27 ENCOUNTER — Other Ambulatory Visit: Payer: Self-pay | Admitting: Gynecology

## 2017-09-27 ENCOUNTER — Ambulatory Visit (INDEPENDENT_AMBULATORY_CARE_PROVIDER_SITE_OTHER): Payer: Medicare Other

## 2017-09-27 ENCOUNTER — Encounter: Payer: Self-pay | Admitting: Anesthesiology

## 2017-09-27 DIAGNOSIS — Z780 Asymptomatic menopausal state: Secondary | ICD-10-CM

## 2017-09-27 DIAGNOSIS — M8580 Other specified disorders of bone density and structure, unspecified site: Secondary | ICD-10-CM

## 2017-09-27 DIAGNOSIS — M85851 Other specified disorders of bone density and structure, right thigh: Secondary | ICD-10-CM

## 2017-09-27 DIAGNOSIS — M8588 Other specified disorders of bone density and structure, other site: Secondary | ICD-10-CM | POA: Diagnosis not present

## 2018-04-06 ENCOUNTER — Encounter: Payer: Self-pay | Admitting: Gynecology

## 2018-09-11 ENCOUNTER — Encounter: Payer: Medicare Other | Admitting: Gynecology

## 2018-10-09 ENCOUNTER — Other Ambulatory Visit: Payer: Self-pay

## 2018-10-09 ENCOUNTER — Ambulatory Visit: Payer: Medicare Other | Admitting: Gynecology

## 2018-10-09 ENCOUNTER — Encounter: Payer: Self-pay | Admitting: Gynecology

## 2018-10-09 ENCOUNTER — Encounter: Payer: Medicare Other | Admitting: Gynecology

## 2018-10-09 VITALS — BP 124/80 | Ht 66.0 in | Wt 167.0 lb

## 2018-10-09 DIAGNOSIS — N952 Postmenopausal atrophic vaginitis: Secondary | ICD-10-CM | POA: Diagnosis not present

## 2018-10-09 DIAGNOSIS — M8580 Other specified disorders of bone density and structure, unspecified site: Secondary | ICD-10-CM | POA: Diagnosis not present

## 2018-10-09 DIAGNOSIS — Z01419 Encounter for gynecological examination (general) (routine) without abnormal findings: Secondary | ICD-10-CM | POA: Diagnosis not present

## 2018-10-09 NOTE — Patient Instructions (Signed)
Follow-up in 1 year for annual exam, sooner as needed. 

## 2018-10-09 NOTE — Progress Notes (Signed)
    Mindy Burns 1950-08-22 [id number]        69 y.o.  G0P0 for annual gynecologic exam.  Without gynecologic complaints  Past medical history,surgical history, problem list, medications, allergies, family history and social history were all reviewed and documented as reviewed in the EPIC chart.  ROS:  Performed with pertinent positives and negatives included in the history, assessment and plan.   Additional significant findings : None   Exam: [name] assistant Vitals:   10/09/18 1146  BP: 124/80  Weight: 167 lb (75.8 kg)  Height: 5\' 6"  (1.676 m)   Body mass index is 26.95 kg/m.  General appearance:  Normal affect, orientation and appearance. Skin: Grossly normal HEENT: Without gross lesions.  No cervical or supraclavicular adenopathy. Thyroid normal.  Lungs:  Clear without wheezing, rales or rhonchi Cardiac: RR, without RMG Abdominal:  Soft, nontender, without masses, guarding, rebound, organomegaly or hernia Breasts:  Examined lying and sitting without masses, retractions, discharge or axillary adenopathy. Pelvic:  Ext, BUS, Vagina: With atrophic changes  Adnexa: Without masses or tenderness    Anus and perineum: Normal   Rectovaginal: Normal sphincter tone without palpated masses or tenderness.    Assessment/Plan:  68 y.o. G0P0 female for annual gynecologic exam.   1. Postmenopausal.  Status post TAH/BSO in the past.  No significant menopausal symptoms. 2. Pap smear 2014.  No Pap smear done today.  No history of abnormal Pap smears.  We both agree to stop screening per current screening guidelines. 3. Colonoscopy 2014.  Repeat at their recommended interval. 4. Mammography 04/2018.  Continue with annual mammography when due.  Breast exam normal today. 5. Osteopenia.  DEXA 09/2017 T score -1.3 FRAX 8.7% / 0.8%.  Stable from prior DEXA.  Plan repeat DEXA in another year or 2. 6. Health maintenance.  No routine lab work done as patient does this elsewhere.  Follow-up 1  year, sooner as needed.   [name] MD, 1:01 PM 10/09/2018

## 2018-11-29 ENCOUNTER — Encounter: Payer: Self-pay | Admitting: Gynecology

## 2018-12-25 ENCOUNTER — Other Ambulatory Visit: Payer: Self-pay

## 2018-12-27 ENCOUNTER — Ambulatory Visit: Payer: Medicare Other | Admitting: Gynecology

## 2018-12-27 ENCOUNTER — Other Ambulatory Visit: Payer: Self-pay

## 2018-12-27 ENCOUNTER — Encounter: Payer: Self-pay | Admitting: Gynecology

## 2018-12-27 VITALS — BP 124/76

## 2018-12-27 DIAGNOSIS — N644 Mastodynia: Secondary | ICD-10-CM

## 2018-12-27 NOTE — Progress Notes (Signed)
    Mindy Burns 04-20-1950 [id number]        68 y.o.  G0P0 presents with a 1 to 2-week history of left breast tenderness and nodularity in the tail of Spence region not perceived previously.  Last mammography 04/2018.  Maternal history of breast cancer.  Past medical history,surgical history, problem list, medications, allergies, family history and social history were all reviewed and documented in the EPIC chart.  Directed ROS with pertinent positives and negatives documented in the history of present illness/assessment and plan.  Exam: [name] assistant Vitals:   12/27/18 1054  BP: 124/76   General appearance:  Normal Both breasts examined lying and sitting without masses retractions discharge adenopathy.  The area of the patient's pointing to is the left tail of Spence.  No definitive masses noted.  Assessment/Plan:  68 y.o. G0P0 with new onset of left breast tenderness and nodularity to self breast exam.  Physician exam is normal.  Maternal history of breast cancer.  Recommend proceeding with diagnostic mammography and ultrasound of the left breast.  We will arrange this for her and she knows to call if she does not hear from [facility] within the next week or so to arrange.    [name] MD, 11:13 AM 12/27/2018

## 2018-12-27 NOTE — Patient Instructions (Signed)
[facility] will call you to arrange for the diagnostic mammogram and ultrasound.  Call my office if you do not hear from them within the next week or so.

## 2019-01-03 ENCOUNTER — Other Ambulatory Visit: Payer: Self-pay | Admitting: *Deleted

## 2019-01-03 NOTE — Progress Notes (Signed)
Order sent to [facility] for DX mammo and left breast US for tenderness and nodularity at tail of spence area.  Ask for them to call pt when received order. KW CMA

## 2019-01-10 NOTE — Progress Notes (Signed)
Pt scheduled for 01/15/19 [facility] CMA

## 2019-01-16 ENCOUNTER — Encounter: Payer: Self-pay | Admitting: Gynecology

## 2019-01-16 ENCOUNTER — Other Ambulatory Visit: Payer: Self-pay

## 2019-01-16 DIAGNOSIS — Z20822 Contact with and (suspected) exposure to covid-19: Secondary | ICD-10-CM

## 2019-01-17 ENCOUNTER — Telehealth: Payer: Self-pay | Admitting: *Deleted

## 2019-01-17 LAB — NOVEL CORONAVIRUS, NAA: SARS-CoV-2, NAA: NOT DETECTED

## 2019-01-17 NOTE — Telephone Encounter (Signed)
(  patinet is aware you are not in the office)  Patient called had breast imaging done at [facility] results have now been scanned in epic. She would like your thoughts about what she should do next? Please advise

## 2019-01-19 NOTE — Telephone Encounter (Signed)
I recommend the patient proceed with biopsy as recommended by radiology

## 2019-01-22 ENCOUNTER — Telehealth: Payer: Self-pay

## 2019-01-22 ENCOUNTER — Encounter: Payer: Self-pay | Admitting: Gynecology

## 2019-01-22 NOTE — Telephone Encounter (Signed)
Opened in error

## 2019-01-22 NOTE — Telephone Encounter (Signed)
Patient informed. 

## 2019-01-23 NOTE — Telephone Encounter (Signed)
I called patient in follow-up of her mammogram and ultrasound report.  Apparently the radiologist had discussed the option to relook at this area in 6 months although this is not documented in the report.  The report states suspicious for malignancy recommend biopsy.  The options for needle biopsy versus excisional biopsy were reviewed with the patient and the patient would like to proceed with excisional biopsy and she will arrange an appointment to see a general surgeon.  She will call me if she has any issues arranging this appointment.

## 2019-02-19 ENCOUNTER — Other Ambulatory Visit: Payer: Self-pay | Admitting: Radiology

## 2019-04-05 ENCOUNTER — Ambulatory Visit: Payer: Medicare Other

## 2019-05-02 ENCOUNTER — Telehealth: Payer: Self-pay | Admitting: *Deleted

## 2019-05-02 NOTE — Telephone Encounter (Signed)
Patient called stating she needs order faxed to [facility] for 6 month follow for imagining. I called [facility] to confirm, and was told patient needs diag. bilateral mammogram and bilateral ultrasound. Order faxed patient aware.

## 2019-06-02 ENCOUNTER — Other Ambulatory Visit: Payer: Self-pay

## 2019-06-02 ENCOUNTER — Ambulatory Visit (HOSPITAL_COMMUNITY)
Admission: EM | Admit: 2019-06-02 | Discharge: 2019-06-02 | Disposition: A | Discharge status: Home / Self Care | Payer: Medicare PPO | Attending: Family Medicine | Admitting: Family Medicine

## 2019-06-02 ENCOUNTER — Encounter (HOSPITAL_COMMUNITY): Payer: Self-pay

## 2019-06-02 DIAGNOSIS — L509 Urticaria, unspecified: Secondary | ICD-10-CM

## 2019-06-02 MED ORDER — PREDNISONE 10 MG (21) PO TBPK: ORAL_TABLET | Freq: Every day | ORAL | 0 refills | Status: DC

## 2019-06-02 NOTE — ED Triage Notes (Signed)
Pt c/o rash on inner thighs, back, waist line, arms bilat since yesterday. Pt states she just left the beach yesterday. Pt states it's itching. Pt denies new meds, products or foods.

## 2019-06-04 NOTE — ED Provider Notes (Signed)
[facility]   [id number] 06/02/19 Arrival Time: [id number]  ASSESSMENT & PLAN:  1. Urticaria    No s/s of anaphylaxis.   Begin: Meds ordered this encounter  Medications  . predniSONE (STERAPRED UNI-PAK 21 TAB) 10 MG (21) TBPK tablet    Sig: Take by mouth daily. Take as directed.    Dispense:  21 tablet    Refill:  0    Benadryl if needed. Will follow up with PCP or here if worsening or failing to improve as anticipated. Reviewed expectations re: course of current medical issues. Questions answered. Outlined signs and symptoms indicating need for more acute intervention. Patient verbalized understanding. After Visit Summary given.   SUBJECTIVE:  Mindy Burns is a 69 y.o. female who presents with a skin complaint. Itchy rash; "all over"; abrupt onset yesterday. Questions chiggers as trigger. No new medications or foods. Normal PO intake without n/v. No breathing problems.      OBJECTIVE: Vitals:   06/02/19 1336  BP: 134/87  Pulse: 70  Resp: 16  Temp: 98.4 F (36.9 C)  TempSrc: Oral  SpO2: 99%  Weight: 71.2 kg  Height: 5\' 6"  (1.676 m)    General appearance: alert; no distress HEENT: [place]; AT Neck: supple Resp: speaks full sentences without difficulty Extremities: no edema; moves all extremities normally Skin: warm and dry; smooth, random areas of slightly elevated and erythematous plaques/wheals of variable size from neck down Psychological: alert and cooperative; normal mood and affect  Allergies  Allergen Reactions  . Darvon   . Zithromax [Azithromycin]     TOOK Z-PAC. "FEELS WEIRD".  . Codeine Anxiety    Past Medical History:  Diagnosis Date  . Anxiety   . 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 Hypothyroid   . Osteopenia 09/2017   T score -1.3 FRAX 8.7% / 0.8% stable from prior DEXA   Social History   Socioeconomic History  . Marital status: Single    Spouse name: Not on file  .  Number of children: Not on file  . Years of education: Not on file  . Highest education level: Not on file  Occupational History  . Not on file  Tobacco Use  . Smoking status: Never Smoker  . Smokeless tobacco: Never Used  Substance and Sexual Activity  . Alcohol use: Yes    Alcohol/week: 14.0 standard drinks    Types: 7 Glasses of wine, 7 Cans of beer per week  . Drug use: No  . Sexual activity: Not Currently    Comment: Pt. declined sexual hx questions  Other Topics Concern  . Not on file  Social History Narrative  . Not on file   Social Determinants of Health   Financial Resource Strain:   . Difficulty of Paying Living Expenses:   Food Insecurity:   . Worried About Charity fundraiser in the Last Year:   . Arboriculturist in the Last Year:   Transportation Needs:   . Film/video editor (Medical):   Marland Kitchen Lack of Transportation (Non-Medical):   Physical Activity:   . Days of Exercise per Week:   . Minutes of Exercise per Session:   Stress:   . Feeling of Stress :   Social Connections:   . Frequency of Communication with Friends and Family:   . Frequency of Social Gatherings with Friends and Family:   . Attends Religious Services:   . Active Member of Clubs or Organizations:   .  Attends Archivist Meetings:   Marland Kitchen Marital Status:   Intimate Partner Violence:   . Fear of Current or Ex-Partner:   . Emotionally Abused:   Marland Kitchen Physically Abused:   . Sexually Abused:    Family History  Problem Relation Age of Onset  . Breast cancer Mother 51  . Cancer Mother        leukemia  . Hypertension Father   . Hypertension Brother    Past Surgical History:  Procedure Laterality Date  . ABDOMINAL HYSTERECTOMY     TAH,BSO  . BLADDER SURGERY  2007   RESECTION OF BLADDER TUMOR./STAGE TA TRANSITIONAL CELL CA OF BLADDER  . EYE SURGERY  2011   RIGHT EYE SURGERY X 3  . HYSTEROSCOPY     MYOMECTOMY X 2  . MYOMECTOMY     x2  . OOPHORECTOMY     BSO     Vanessa Kick,  MD 06/04/19 [phone]

## 2019-06-19 ENCOUNTER — Encounter: Payer: Self-pay | Admitting: Obstetrics and Gynecology

## 2019-08-23 HISTORY — PX: KNEE SURGERY: SHX244

## 2019-10-11 ENCOUNTER — Encounter: Payer: Medicare Other | Admitting: Obstetrics and Gynecology

## 2019-11-13 ENCOUNTER — Ambulatory Visit: Payer: Medicare PPO | Admitting: Obstetrics and Gynecology

## 2019-11-13 ENCOUNTER — Other Ambulatory Visit: Payer: Self-pay

## 2019-11-13 ENCOUNTER — Encounter: Payer: Self-pay | Admitting: Obstetrics and Gynecology

## 2019-11-13 VITALS — BP 118/76 | Ht 66.0 in | Wt 160.0 lb

## 2019-11-13 DIAGNOSIS — Z01419 Encounter for gynecological examination (general) (routine) without abnormal findings: Secondary | ICD-10-CM

## 2019-11-13 DIAGNOSIS — M8580 Other specified disorders of bone density and structure, unspecified site: Secondary | ICD-10-CM

## 2019-11-13 NOTE — Progress Notes (Signed)
   KEYANNA SANDEFER [birth year] [id number]  SUBJECTIVE:  69 y.o. G0P0 female here for a breast and pelvic exam. She has no gynecologic concerns.  Current Outpatient Medications  Medication Sig Dispense Refill  . Cholecalciferol (VITAMIN D-3 PO) Take by mouth.      . citalopram (CELEXA) 10 MG tablet Take 20 mg by mouth daily.     . fish oil-omega-3 fatty acids 1000 MG capsule Take 2 g by mouth daily.      Marland Kitchen levalbuterol (XOPENEX HFA) 45 MCG/ACT inhaler Inhale 1-2 puffs into the lungs as needed.    Marland Kitchen levothyroxine (SYNTHROID, LEVOTHROID) 88 MCG tablet Take 100 mcg by mouth daily before breakfast.     . Psyllium (METAMUCIL PO) Take by mouth.      . predniSONE (STERAPRED UNI-PAK 21 TAB) 10 MG (21) TBPK tablet Take by mouth daily. Take as directed. (Patient not taking: Reported on 11/13/2019) 21 tablet 0   No current facility-administered medications for this visit.   Allergies: Darvon, Zithromax [azithromycin], and Codeine  No LMP recorded. Patient has had a hysterectomy.  Past medical history,surgical history, problem list, medications, allergies, family history and social history were all reviewed and documented as reviewed in the EPIC chart.  GYN ROS: no abnormal bleeding, pelvic pain or discharge, no breast pain or new or enlarging lumps on self exam.  No dysuria, frequency, burning, pain with urination, cloudy/malodorous urine.   OBJECTIVE:  BP 118/76   Ht 5\' 6"  (1.676 m)   Wt 160 lb (72.6 kg)   BMI 25.82 kg/m  The patient appears well, alert, oriented, in no distress.  BREAST EXAM: breasts appear normal, no suspicious masses, no skin or nipple changes or axillary nodes  PELVIC EXAM: VULVA: normal appearing vulva with atrophic changes no masses, tenderness or lesions, VAGINA: normal appearing vagina with atrophic change, normal color and discharge, no lesions, CERVIX: surgically absent, UTERUS: surgically absent, vaginal cuff normal, ADNEXA: no masses, nontender  Chaperone: Aurora Mask (DNP student) present during the examination and performed the pelvic exam with me in attendance to confirm the exam findings   ASSESSMENT:  69 y.o. G0P0 here for a breast and pelvic exam  PLAN:   1. Postmenopausal. Prior TAH/BSO.  No significant menopausal symptoms. 2. Pap smear 2014.  No significant history of abnormal Pap smears.  She has agreed to stop screening following the current screening guidelines based on age and prior hysterectomy/normal Pap smear criteria. 3. Mammogram 05/2019.  Normal breast exam today.  Maternal history breast cancer.  Had some transient left breast tenderness last year and had mammography, and she reported that the breast evaluation was benign.  She will continue with annual mammograms. 4. Colonoscopy 2014.  She will follow up at the interval recommended by her GI specialist.   5. DEXA 09/2017.  T score -1.3, FRAX 8.7% / 0.8%.  Stable from prior DEXA.  Staying physically active, has started weight training in the last year.  Next DEXA recommended this year so she plans to schedule this. 6. Health maintenance.  No labs today as she normally has these completed elsewhere.  Return annually or sooner, prn.  Joseph Pierini MD 11/13/19

## 2019-12-05 ENCOUNTER — Other Ambulatory Visit: Payer: Self-pay

## 2019-12-05 ENCOUNTER — Other Ambulatory Visit: Payer: Self-pay | Admitting: Obstetrics and Gynecology

## 2019-12-05 ENCOUNTER — Ambulatory Visit (INDEPENDENT_AMBULATORY_CARE_PROVIDER_SITE_OTHER): Payer: Medicare PPO

## 2019-12-05 DIAGNOSIS — Z780 Asymptomatic menopausal state: Secondary | ICD-10-CM

## 2019-12-05 DIAGNOSIS — M8589 Other specified disorders of bone density and structure, multiple sites: Secondary | ICD-10-CM

## 2019-12-05 DIAGNOSIS — Z01419 Encounter for gynecological examination (general) (routine) without abnormal findings: Secondary | ICD-10-CM

## 2019-12-05 DIAGNOSIS — M8580 Other specified disorders of bone density and structure, unspecified site: Secondary | ICD-10-CM

## 2020-11-14 ENCOUNTER — Encounter: Payer: Medicare PPO | Admitting: Obstetrics and Gynecology

## 2020-11-14 ENCOUNTER — Ambulatory Visit: Payer: Self-pay | Admitting: Obstetrics & Gynecology

## 2021-01-01 ENCOUNTER — Other Ambulatory Visit: Payer: Self-pay | Admitting: Orthopaedic Surgery

## 2021-01-01 DIAGNOSIS — M25511 Pain in right shoulder: Secondary | ICD-10-CM

## 2021-01-12 ENCOUNTER — Ambulatory Visit (INDEPENDENT_AMBULATORY_CARE_PROVIDER_SITE_OTHER): Payer: Medicare PPO | Admitting: Obstetrics & Gynecology

## 2021-01-12 ENCOUNTER — Encounter: Payer: Self-pay | Admitting: Obstetrics & Gynecology

## 2021-01-12 ENCOUNTER — Other Ambulatory Visit: Payer: Self-pay

## 2021-01-12 VITALS — BP 110/72 | HR 72 | Resp 16 | Ht 65.25 in | Wt 165.0 lb

## 2021-01-12 DIAGNOSIS — M85852 Other specified disorders of bone density and structure, left thigh: Secondary | ICD-10-CM

## 2021-01-12 DIAGNOSIS — Z90722 Acquired absence of ovaries, bilateral: Secondary | ICD-10-CM

## 2021-01-12 DIAGNOSIS — Z780 Asymptomatic menopausal state: Secondary | ICD-10-CM

## 2021-01-12 DIAGNOSIS — Z9189 Other specified personal risk factors, not elsewhere classified: Secondary | ICD-10-CM | POA: Diagnosis not present

## 2021-01-12 DIAGNOSIS — Z01419 Encounter for gynecological examination (general) (routine) without abnormal findings: Secondary | ICD-10-CM

## 2021-01-12 DIAGNOSIS — M810 Age-related osteoporosis without current pathological fracture: Secondary | ICD-10-CM

## 2021-01-12 DIAGNOSIS — E663 Overweight: Secondary | ICD-10-CM

## 2021-01-12 DIAGNOSIS — Z90710 Acquired absence of both cervix and uterus: Secondary | ICD-10-CM

## 2021-01-12 DIAGNOSIS — M85851 Other specified disorders of bone density and structure, right thigh: Secondary | ICD-10-CM | POA: Diagnosis not present

## 2021-01-12 DIAGNOSIS — Z9079 Acquired absence of other genital organ(s): Secondary | ICD-10-CM

## 2021-01-12 NOTE — Progress Notes (Signed)
Mindy Burns October 07, 1950 [id number]   History:    70 y.o.   RP:  Established patient presenting for annual gyn exam   HPI: Postmenopausal. Prior TAH/BSO.  No significant menopausal symptoms. Pap smear 2014.  No significant history of abnormal Pap smears.  Breasts normal.  Mammogram Neg 06/2020.  Maternal history breast cancer.  Colonoscopy 2014.  BD 11/2019 Osteopenia T-Score -1.7 at the Rt Fem Neck.  Improved at the AP Spine from prior DEXA.  BMI 27.25. Staying physically active, walking, weight training and Yoga. Planning weight loss with Mediterranean diet and no snack.  Health labs with Fam MD.   Past medical history,surgical history, family history and social history were all reviewed and documented in the EPIC chart.  Gynecologic History No LMP recorded. Patient has had a hysterectomy.  Obstetric History OB History  Gravida Para Term Preterm AB Living  0            SAB IAB Ectopic Multiple Live Births                ROS: A ROS was performed and pertinent positives and negatives are included in the history.  GENERAL: No fevers or chills. HEENT: No change in vision, no earache, sore throat or sinus congestion. NECK: No pain or stiffness. CARDIOVASCULAR: No chest pain or pressure. No palpitations. PULMONARY: No shortness of breath, cough or wheeze. GASTROINTESTINAL: No abdominal pain, nausea, vomiting or diarrhea, melena or bright red blood per rectum. GENITOURINARY: No urinary frequency, urgency, hesitancy or dysuria. MUSCULOSKELETAL: No joint or muscle pain, no back pain, no recent trauma. DERMATOLOGIC: No rash, no itching, no lesions. ENDOCRINE: No polyuria, polydipsia, no heat or cold intolerance. No recent change in weight. HEMATOLOGICAL: No anemia or easy bruising or bleeding. NEUROLOGIC: No headache, seizures, numbness, tingling or weakness. PSYCHIATRIC: No depression, no loss of interest in normal activity or change in sleep pattern.     Exam:   BP 110/72   Pulse 72    Resp 16   Ht 5' 5.25" (1.657 m)   Wt 165 lb (74.8 kg)   BMI 27.25 kg/m   Body mass index is 27.25 kg/m.  General appearance : Well developed well nourished female. No acute distress HEENT: Eyes: no retinal hemorrhage or exudates,  Neck supple, trachea midline, no carotid bruits, no thyroidmegaly Lungs: Clear to auscultation, no rhonchi or wheezes, or rib retractions  Heart: Regular rate and rhythm, no murmurs or gallops Breast:Examined in sitting and supine position were symmetrical in appearance, no palpable masses or tenderness,  no skin retraction, no nipple inversion, no nipple discharge, no skin discoloration, no axillary or supraclavicular lymphadenopathy Abdomen: no palpable masses or tenderness, no rebound or guarding Extremities: no edema or skin discoloration or tenderness  Pelvic: Vulva: Normal             Vagina: No gross lesions or discharge  Cervix/Uterus absent  Adnexa  Without masses or tenderness  Anus: Normal   Assessment/Plan:  70 y.o. female for annual exam   1. Well female exam with routine gynecological exam Postmenopausal. Prior TAH/BSO.  No significant menopausal symptoms. Pap smear 2014.  No significant history of abnormal Pap smears.  Breasts normal.  Mammogram Neg 06/2020.  Maternal history breast cancer.  Colonoscopy 2014.  BD 11/2019 Osteopenia T-Score -1.7 at the Rt Fem Neck.  Improved at the AP Spine from prior DEXA.  BMI 27.25. Staying physically active, walking, weight training and Yoga. Planning weight loss with Mediterranean diet and  no snack.  Health labs with Fam MD.   2. At risk of fracture due to osteopenia  3. S/P TAH-BSO  4. Postmenopause Postmenopausal. Prior TAH/BSO.  No significant menopausal symptoms.   5. Osteopenia of necks of both femurs BD 11/2019 Osteopenia T-Score -1.7 at the Rt Fem Neck.  Improved at the AP Spine from prior DEXA.  BMI 27.25. Staying physically active, walking, weight training and Yoga. Vit D, Ca++ 1.5 g/d  total.  6. Overweight (BMI 25.0-29.9) BMI 27.25. Staying physically active, walking, weight training and Yoga. Planning weight loss with Mediterranean diet and no snack.   Other orders - citalopram (CELEXA) 20 MG tablet; Take 1 tablet by mouth daily. - levocetirizine (XYZAL) 5 MG tablet; Take 1 tablet by mouth every evening. - fluticasone furoate-vilanterol (BREO ELLIPTA) 100-25 MCG/ACT AEPB; Inhale into the lungs. - COLLAGEN PO; Take by mouth.   Princess Bruins MD, 2:48 PM 01/12/2021

## 2021-01-19 ENCOUNTER — Other Ambulatory Visit: Payer: Medicare PPO

## 2021-01-26 ENCOUNTER — Other Ambulatory Visit: Payer: Self-pay

## 2021-01-26 ENCOUNTER — Other Ambulatory Visit: Payer: Medicare PPO

## 2021-01-26 ENCOUNTER — Ambulatory Visit
Admission: RE | Admit: 2021-01-26 | Discharge: 2021-01-26 | Disposition: A | Discharge status: Home / Self Care | Payer: Medicare PPO | Source: Ambulatory Visit | Attending: Orthopaedic Surgery | Admitting: Orthopaedic Surgery

## 2021-01-26 DIAGNOSIS — M25511 Pain in right shoulder: Secondary | ICD-10-CM

## 2022-02-25 ENCOUNTER — Ambulatory Visit: Payer: Medicare PPO | Admitting: Obstetrics & Gynecology

## 2022-03-11 IMAGING — MR MR SHOULDER*R* W/O CM
5 series · 36 of 40 positions shown · non-contrast
Comparison: X-ray shoulder 05/22/2020

CLINICAL DATA: Right shoulder pain radiating to arm with limited
range of motion for 10 months.

EXAM:
MRI OF THE RIGHT SHOULDER WITHOUT CONTRAST
TECHNIQUE: Multiplanar, multisequence MR imaging of the shoulder was performed.
No intravenous contrast was administered.

[Series 3: T2 fat-sat · axial · 4.0mm · 0.55mm/px · z∈[-48,+60]mm · 8 of 25 slices shown (1 of 3)]
[im 1/25]
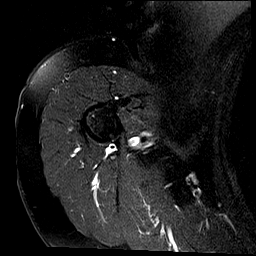
[im 4/25]
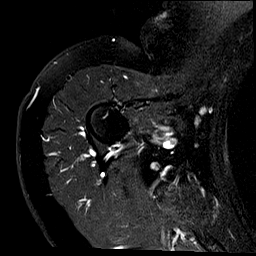
[im 7/25]
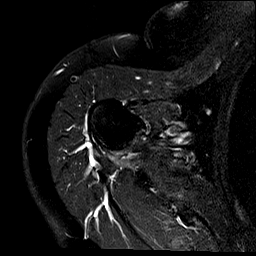
[im 11/25]
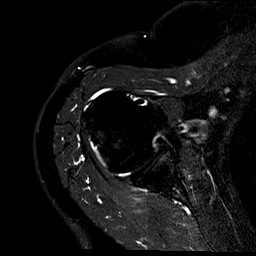
[im 14/25]
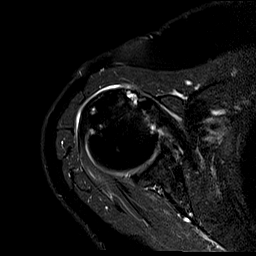
[im 18/25]
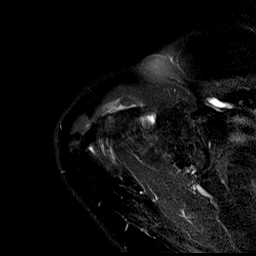
[im 21/25]
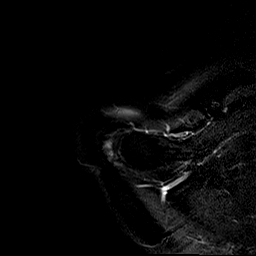
[im 25/25]
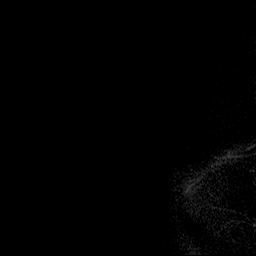

[Series 4: T2 fat-sat · oblique · 4.0mm · 0.59mm/px · 8 of 23 slices shown (2 of 3)]
[im 1/23]
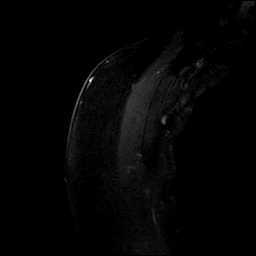
[im 4/23]
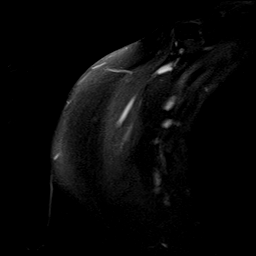
[im 7/23]
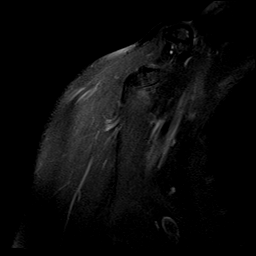
[im 10/23]
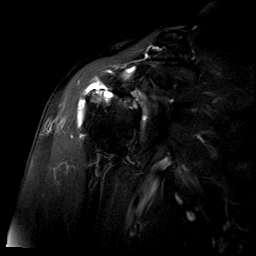
[im 13/23]
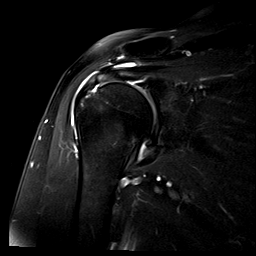
[im 16/23]
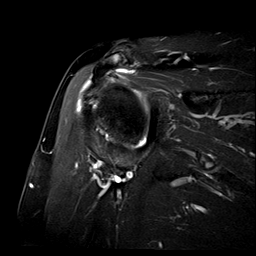
[im 19/23]
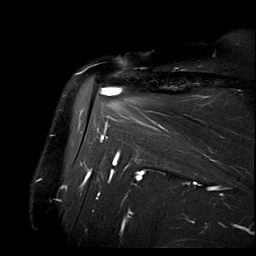
[im 23/23]
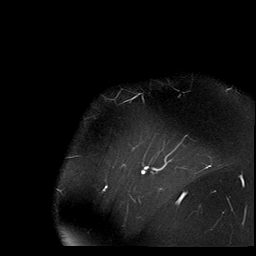

[Series 5: PD · oblique · 4.0mm · 0.29mm/px · 8 of 23 slices shown]
[im 1/23]
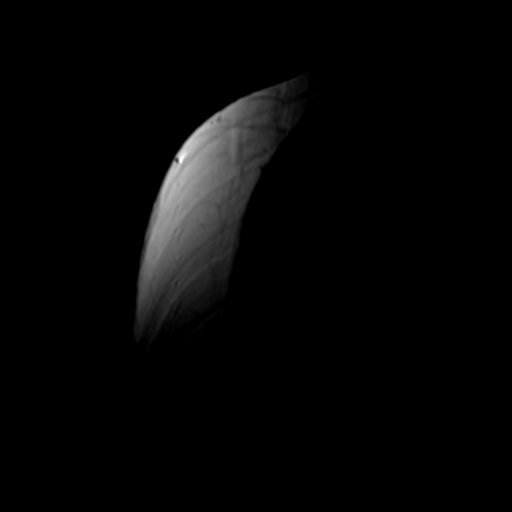
[im 4/23]
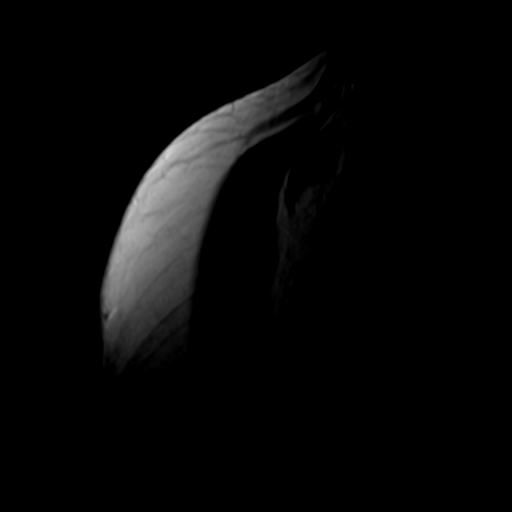
[im 7/23]
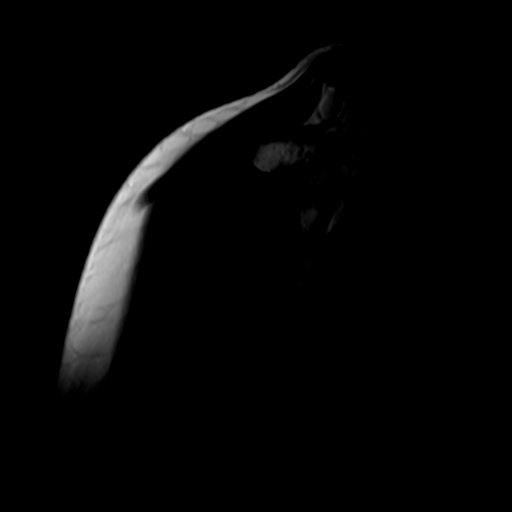
[im 10/23]
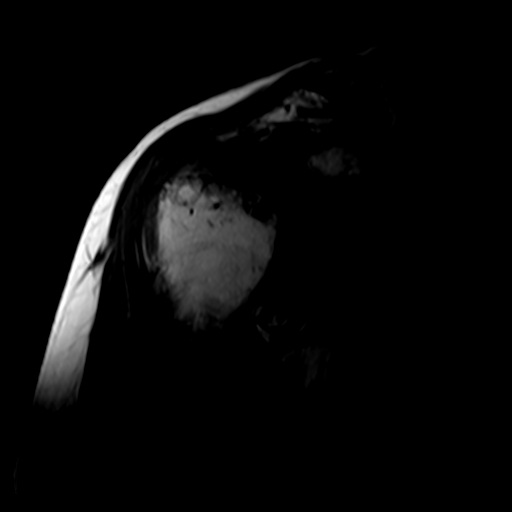
[im 13/23]
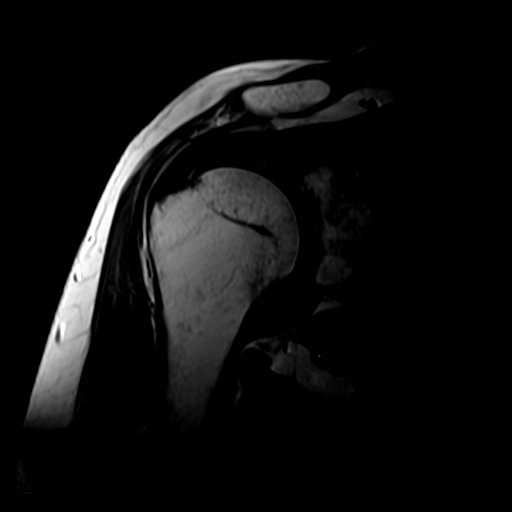
[im 16/23]
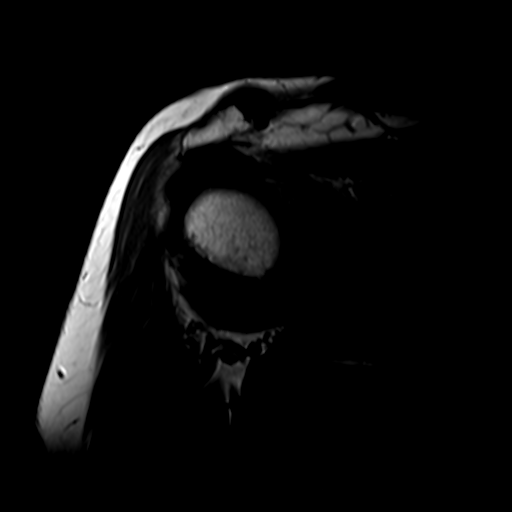
[im 19/23]
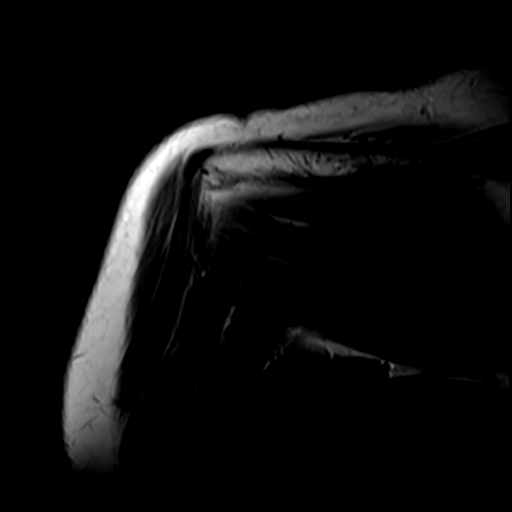
[im 23/23]
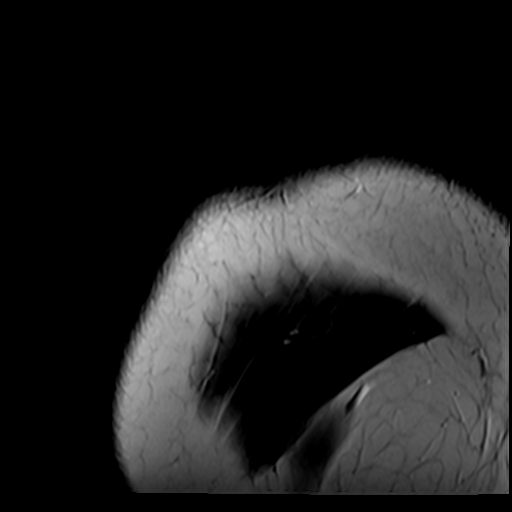

[Series 6: T2 fat-sat · oblique · 4.0mm · 0.62mm/px · 8 of 22 slices shown (3 of 3)]
[im 1/22]
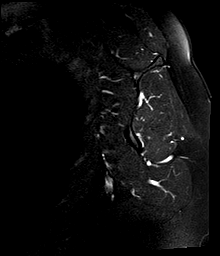
[im 4/22]
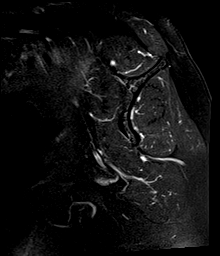
[im 7/22]
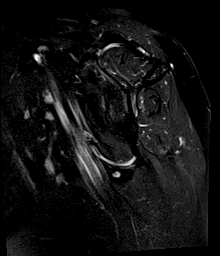
[im 10/22]
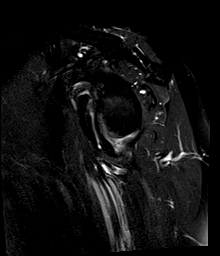
[im 13/22]
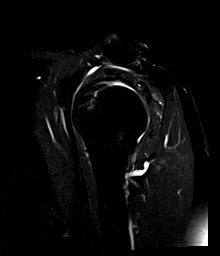
[im 16/22]
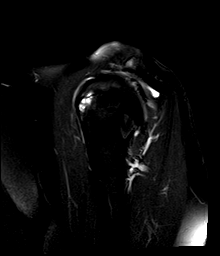
[im 19/22]
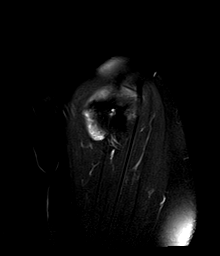
[im 22/22]
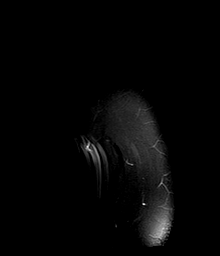

[Series 7: T1 · oblique · 4.0mm · 0.31mm/px · 4 of 22 slices shown]
[im 1/22]
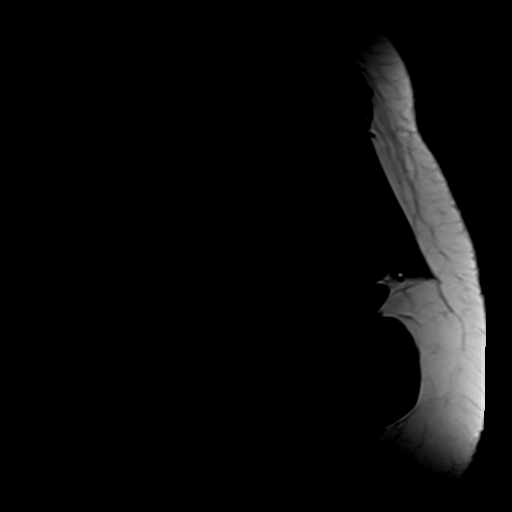
[im 4/22]
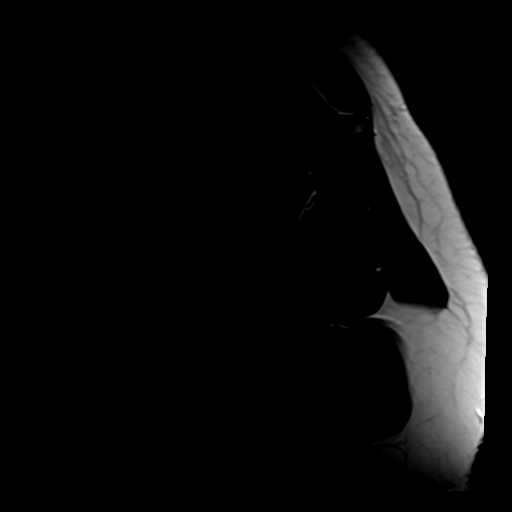
[im 7/22]
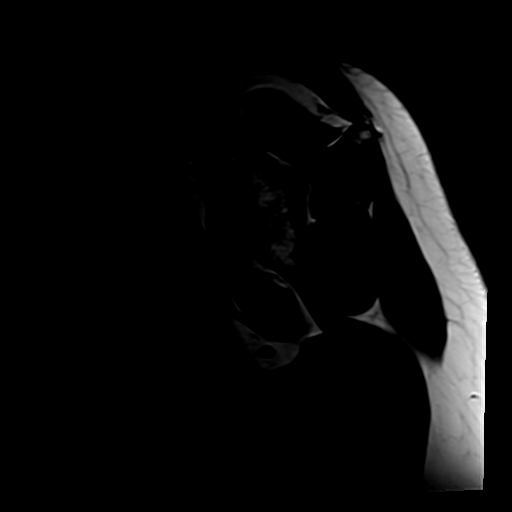
[im 10/22]
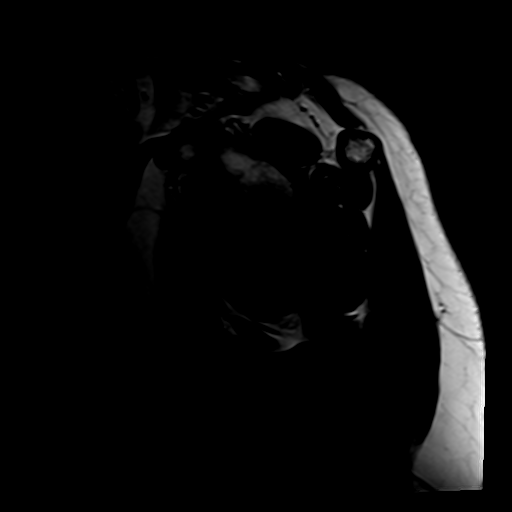

[36 of 40 positions shown; findings below may reference images not displayed]

FINDINGS: Rotator cuff: Severe tendinosis of the supraspinatus tendon with a
large high-grade partial-thickness bursal surface tear with a 8 mm
full-thickness component anteriorly. Severe tendinosis of the
infraspinatus tendon. Teres minor tendon is intact. Subscapularis
tendon is intact.

Muscles: No atrophy or abnormal signal of the muscles of the rotator
cuff.

Biceps long head: Moderate tendinosis of the intra-articular portion
of the long head of the biceps tendon.

Acromioclavicular Joint: Mild arthropathy of the acromioclavicular
joint. Type I acromion. Small amount of subacromial/subdeltoid
bursal fluid.

Glenohumeral Joint: Small joint effusion. Mild partial-thickness
cartilage loss of the glenohumeral joint.

Labrum: Grossly intact, but evaluation is limited by lack of
intraarticular fluid.

Bones: Limited evaluation secondary to lack of intra-articular
contrast. Irregularity of the posterosuperior labrum concerning for
a small tear.

Other: None.
IMPRESSION: 1. Severe tendinosis of the supraspinatus tendon with a large
high-grade partial-thickness bursal surface tear with a 8 mm
full-thickness component anteriorly.
2. Severe tendinosis of the infraspinatus tendon.
3. Moderate tendinosis of the intra-articular portion of the long
head of the biceps tendon.

## 2022-07-30 ENCOUNTER — Encounter: Payer: Self-pay | Admitting: Obstetrics & Gynecology

## 2023-01-18 ENCOUNTER — Encounter: Payer: Medicare PPO | Admitting: Obstetrics and Gynecology

## 2023-01-18 ENCOUNTER — Ambulatory Visit: Payer: Medicare PPO | Admitting: Obstetrics & Gynecology

## 2023-02-04 ENCOUNTER — Ambulatory Visit: Payer: Medicare PPO | Admitting: Obstetrics and Gynecology

## 2023-02-04 ENCOUNTER — Encounter: Payer: Self-pay | Admitting: Obstetrics and Gynecology

## 2023-02-04 VITALS — BP 112/70 | HR 62 | Ht 65.5 in | Wt 161.0 lb

## 2023-02-04 DIAGNOSIS — Z01419 Encounter for gynecological examination (general) (routine) without abnormal findings: Secondary | ICD-10-CM | POA: Diagnosis not present

## 2023-02-04 DIAGNOSIS — N952 Postmenopausal atrophic vaginitis: Secondary | ICD-10-CM | POA: Diagnosis not present

## 2023-02-04 DIAGNOSIS — M8580 Other specified disorders of bone density and structure, unspecified site: Secondary | ICD-10-CM | POA: Diagnosis not present

## 2023-02-04 DIAGNOSIS — R102 Pelvic and perineal pain: Secondary | ICD-10-CM

## 2023-02-04 DIAGNOSIS — Z1211 Encounter for screening for malignant neoplasm of colon: Secondary | ICD-10-CM

## 2023-02-04 LAB — URINALYSIS, COMPLETE W/RFL CULTURE
Bacteria, UA: NONE SEEN /[HPF]
Bilirubin Urine: NEGATIVE
Glucose, UA: NEGATIVE
Hgb urine dipstick: NEGATIVE
Hyaline Cast: NONE SEEN /[LPF]
Ketones, ur: NEGATIVE
Leukocyte Esterase: NEGATIVE
Nitrites, Initial: NEGATIVE
Protein, ur: NEGATIVE
RBC / HPF: NONE SEEN /[HPF] (ref 0–2)
Specific Gravity, Urine: 1.02 (ref 1.001–1.035)
WBC, UA: NONE SEEN /[HPF] (ref 0–5)
pH: 6.5 (ref 5.0–8.0)

## 2023-02-04 LAB — NO CULTURE INDICATED

## 2023-02-04 NOTE — Progress Notes (Signed)
72 y.o. y.o. female here for annual exam. No LMP recorded. Patient has had a hysterectomy.      RP:  Established patient presenting for annual gyn exam   HPI: Postmenopausal. Prior TAH/BSO.  No significant menopausal symptoms. H/o Hrt use for 6 months. Pap smear 2014.  No significant history of abnormal Pap smears.  Breasts normal.  Mammogram Neg 07/30/22  Maternal history breast cancer.  Colonoscopy 2024 and reports as normal.  BD 11/2019 Osteopenia T-Score -1.7 at the Rt Fem Neck.  Improved at the AP Spine from prior DEXA.  BMI 27.25. Staying physically active, walking, weight training and Yoga. Planning weight loss with Mediterranean diet and no snack.  Health labs with Fam MD. Has dermatology for mole checks. Will continue with PMD for exams Body mass index is 26.38 kg/m.      No data to display          Height 5' 5.5" (1.664 m), weight 161 lb (73 kg).  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   Hypothyroid    Osteopenia 09/2017   T score -1.3 FRAX 8.7% / 0.8% stable from prior DEXA   Sleep apnea    Torn meniscus    left knee    Past Surgical History:  Procedure Laterality Date   ABDOMINAL HYSTERECTOMY     TAH,BSO   BLADDER SURGERY  2007   RESECTION OF BLADDER TUMOR./STAGE TA TRANSITIONAL CELL CA OF BLADDER   BREAST SURGERY     Biopsy-benign   EYE SURGERY  2011   RIGHT EYE SURGERY X 3   HYSTEROSCOPY     MYOMECTOMY X 2   KNEE SURGERY Left 08/2019   MYOMECTOMY     x2   OOPHORECTOMY     BSO    Current Outpatient Medications on File Prior to Visit  Medication Sig Dispense Refill   Cholecalciferol (VITAMIN D-3 PO) Take by mouth.        citalopram (CELEXA) 20 MG tablet Take 1 tablet by mouth daily.     COLLAGEN PO Take by mouth.     fish oil-omega-3 fatty acids 1000 MG capsule Take 2 g by mouth daily.       fluticasone furoate-vilanterol (BREO ELLIPTA) 100-25 MCG/ACT AEPB Inhale into the lungs.     levalbuterol (XOPENEX HFA) 45 MCG/ACT inhaler Inhale 1-2 puffs into the lungs as needed.     levocetirizine (XYZAL) 5 MG tablet Take 1 tablet by mouth every evening.     levothyroxine (SYNTHROID, LEVOTHROID) 88 MCG tablet Take 100 mcg by mouth daily before breakfast.      No current facility-administered medications on file prior to visit.    Social History   Socioeconomic History   Marital status: Single    Spouse name: Not on file   Number of children: Not on file   Years of education: Not on file   Highest education level: Not on file  Occupational History   Not on file  Tobacco Use   Smoking status: Never   Smokeless tobacco: Never  Vaping Use   Vaping status: Never Used  Substance and Sexual Activity   Alcohol use: Not Currently    Alcohol/week: 7.0 standard drinks of alcohol    Types: 7 Standard drinks or equivalent per week   Drug use: No   Sexual activity: Not Currently    Birth control/protection: Surgical    Comment: older than 16, less than 5, hysterectomy  Other Topics Concern   Not on file  Social History Narrative   Not on file   Social Drivers of Health   Financial Resource Strain: Low Risk  (10/04/2022)   Received [facility]   Overall Financial Resource Strain (CARDIA)    Difficulty of Paying Living Expenses: Not hard at all  Food Insecurity: No Food Insecurity (10/04/2022)   [facility]   Hunger Vital Sign    Worried About Running Out of Food in the Last Year: Never true    Ran Out of Food in the Last Year: Never true  Transportation Needs: No Transportation Needs (10/04/2022)   Received from [facility] - Transportation    Lack of Transportation  (Medical): No    Lack of Transportation (Non-Medical): No  Physical Activity: Sufficiently Active (10/04/2022)   Received from [facility]   Exercise Vital Sign    Days of Exercise per Week: 7 days    Minutes of Exercise per Session: 40 min  Stress: No Stress Concern Present (10/04/2022)   Received from [facility] - Occupational Stress Questionnaire    Feeling of Stress : Not at all  Social Connections: Socially Integrated (10/04/2022)   Received from [facility]   Social Network    How would you rate your social network (family, work, friends)?: Good participation with social networks  Intimate Partner Violence: Not At Risk (10/04/2022)   Received from [facility]   HITS    Over the last 12 months how often did your partner physically hurt you?: Never    Over the last 12 months how often did your partner insult you or talk down to you?: Never    Over the last 12 months how often did your partner threaten you with physical harm?: Never    Over the last 12 months how often did your partner scream or curse at you?: Never    Family History  Problem Relation Age of Onset   Breast cancer Mother 93   Cancer Mother        leukemia   Hypertension Father    Hypertension Brother      Allergies  Allergen Reactions   Darvon    Zithromax [Azithromycin]     TOOK Z-PAC. "FEELS WEIRD".   Codeine Anxiety      Patient's last menstrual period was No LMP recorded. Patient has had a hysterectomy.Marland Kitchen          Sexually active: no  Exercising: walking daily   Review of Systems Alls systems reviewed and are negative.     Physical Exam Constitutional:      Appearance: Normal appearance.  Genitourinary:     Vulva normal.     No lesions in the vagina.     Right Labia: No rash, lesions or skin changes.    Left Labia: No lesions, skin changes or rash.    Vaginal cuff intact.    No vaginal discharge or tenderness.     No vaginal prolapse  present.    Severe vaginal atrophy present.     Right Adnexa: absent.  Left Adnexa: absent.    Cervix is absent.     Uterus is absent.  Breasts:    Right: Normal.     Left: Normal.  HENT:     Head: Normocephalic.  Neck:     Thyroid: No thyroid mass, thyromegaly or thyroid tenderness.  Cardiovascular:     Rate and Rhythm: Normal rate and regular rhythm.     Heart sounds: Normal heart sounds, S1 normal and S2 normal.  Pulmonary:     Effort: Pulmonary effort is normal.     Breath sounds: Normal breath sounds and air entry.  Abdominal:     General: Bowel sounds are normal. There is no distension.     Palpations: Abdomen is soft. There is no mass.     Tenderness: There is no abdominal tenderness. There is no guarding or rebound.  Musculoskeletal:     Cervical back: Full passive range of motion without pain, normal range of motion and neck supple. No tenderness.     Right lower leg: No edema.     Left lower leg: No edema.  Neurological:     Mental Status: She is alert.  Skin:    General: Skin is warm.  Psychiatric:        Mood and Affect: Mood normal.        Behavior: Behavior normal.        Thought Content: Thought content normal.  Vitals and nursing note reviewed. Exam conducted with a chaperone present.       A:         Well Woman GYN exam                             P:        Pap smear not indicated Encouraged annual mammogram screening Colon cancer screening up-to-date DXA ordered today Labs and immunizations to do with PMD Encouraged healthy lifestyle practices Encouraged Vit D and Calcium   No follow-ups on file.  Earley Favor

## 2023-04-07 ENCOUNTER — Telehealth: Payer: Self-pay

## 2023-04-07 NOTE — Telephone Encounter (Signed)
EB pt LVM in appt desk line, transferred to triage inquiring about DXA appt that was recommended to her at her [facility] in 01/2023. Reported that she has not heard anything from anyone and would like to go ahead and schedule. Request cb.

## 2023-04-07 NOTE — Telephone Encounter (Signed)
Order signed and faxed successfully to [facility]. Encounter closed.

## 2023-04-07 NOTE — Telephone Encounter (Signed)
Pt called and LVM in triage line thanking for the information and reported that she wasn't aware that we no longer to it here in the office.  She plans to call [facility] to schedule.  Order written up and placed on provider's desk for approval.

## 2023-04-07 NOTE — Telephone Encounter (Signed)
LDVM on machine per DPR advising the pt to call us back and let us know if she has a preferred location to go to for her DXA so that order can be placed.

## 2023-04-18 ENCOUNTER — Encounter: Payer: Self-pay | Admitting: Obstetrics and Gynecology

## 2023-07-08 ENCOUNTER — Other Ambulatory Visit: Payer: Self-pay | Admitting: Urology

## 2023-07-08 DIAGNOSIS — Z8551 Personal history of malignant neoplasm of bladder: Secondary | ICD-10-CM

## 2023-07-12 ENCOUNTER — Encounter: Payer: Self-pay | Admitting: Urology

## 2023-07-13 ENCOUNTER — Ambulatory Visit
Admission: RE | Admit: 2023-07-13 | Discharge: 2023-07-13 | Disposition: A | Discharge status: Home / Self Care | Source: Ambulatory Visit | Attending: Urology | Admitting: Urology

## 2023-07-13 DIAGNOSIS — Z8551 Personal history of malignant neoplasm of bladder: Secondary | ICD-10-CM

## 2023-07-13 MED ORDER — IOPAMIDOL (ISOVUE-300) INJECTION 61%
100.0000 mL | Freq: Once | INTRAVENOUS | Status: AC | PRN
  Administered 2023-07-13: 100 mL via INTRAVENOUS
